# Patient Record
Sex: Female | Born: 1965
Health system: Southern US, Community
[De-identification: ages and names within clinical notes are randomized; demographics above are authoritative.]

## PROBLEM LIST (undated history)

## (undated) DIAGNOSIS — M722 Plantar fascial fibromatosis: Secondary | ICD-10-CM

## (undated) DIAGNOSIS — N6019 Diffuse cystic mastopathy of unspecified breast: Secondary | ICD-10-CM

## (undated) DIAGNOSIS — E785 Hyperlipidemia, unspecified: Secondary | ICD-10-CM

## (undated) DIAGNOSIS — G500 Trigeminal neuralgia: Secondary | ICD-10-CM

## (undated) DIAGNOSIS — I10 Essential (primary) hypertension: Secondary | ICD-10-CM

## (undated) DIAGNOSIS — C439 Malignant melanoma of skin, unspecified: Secondary | ICD-10-CM

## (undated) HISTORY — DX: Essential (primary) hypertension: I10

## (undated) HISTORY — DX: Plantar fascial fibromatosis: M72.2

## (undated) HISTORY — DX: Diffuse cystic mastopathy of unspecified breast: N60.19

## (undated) HISTORY — DX: Hyperlipidemia, unspecified: E78.5

## (undated) HISTORY — DX: Trigeminal neuralgia: G50.0

## (undated) HISTORY — DX: Malignant melanoma of skin, unspecified: C43.9

---

## 1992-07-25 HISTORY — PX: SPLENECTOMY: SUR1306

## 1998-10-29 ENCOUNTER — Encounter: Payer: Self-pay | Admitting: Orthopedic Surgery

## 1998-10-29 ENCOUNTER — Ambulatory Visit (HOSPITAL_COMMUNITY): Admission: RE | Admit: 1998-10-29 | Discharge: 1998-10-29 | Payer: Self-pay | Admitting: Orthopedic Surgery

## 2001-05-10 ENCOUNTER — Ambulatory Visit (HOSPITAL_COMMUNITY): Admission: RE | Admit: 2001-05-10 | Discharge: 2001-05-10 | Payer: Self-pay | Admitting: *Deleted

## 2001-05-10 ENCOUNTER — Encounter: Payer: Self-pay | Admitting: *Deleted

## 2001-08-16 ENCOUNTER — Ambulatory Visit (HOSPITAL_BASED_OUTPATIENT_CLINIC_OR_DEPARTMENT_OTHER): Admission: RE | Admit: 2001-08-16 | Discharge: 2001-08-16 | Payer: Self-pay | Admitting: Orthopedic Surgery

## 2001-10-25 ENCOUNTER — Ambulatory Visit (HOSPITAL_BASED_OUTPATIENT_CLINIC_OR_DEPARTMENT_OTHER): Admission: RE | Admit: 2001-10-25 | Discharge: 2001-10-25 | Payer: Self-pay | Admitting: Orthopedic Surgery

## 2002-08-15 ENCOUNTER — Encounter: Payer: Self-pay | Admitting: *Deleted

## 2002-08-15 ENCOUNTER — Encounter: Admission: RE | Admit: 2002-08-15 | Discharge: 2002-08-15 | Payer: Self-pay | Admitting: *Deleted

## 2003-08-28 ENCOUNTER — Other Ambulatory Visit: Admission: RE | Admit: 2003-08-28 | Discharge: 2003-08-28 | Payer: Self-pay | Admitting: Family Medicine

## 2004-10-11 ENCOUNTER — Other Ambulatory Visit: Admission: RE | Admit: 2004-10-11 | Discharge: 2004-10-11 | Payer: Self-pay | Admitting: Family Medicine

## 2005-11-18 ENCOUNTER — Other Ambulatory Visit: Admission: RE | Admit: 2005-11-18 | Discharge: 2005-11-18 | Payer: Self-pay | Admitting: Family Medicine

## 2005-12-06 ENCOUNTER — Encounter: Admission: RE | Admit: 2005-12-06 | Discharge: 2005-12-06 | Payer: Self-pay | Admitting: Family Medicine

## 2006-12-27 ENCOUNTER — Encounter: Admission: RE | Admit: 2006-12-27 | Discharge: 2006-12-27 | Payer: Self-pay | Admitting: Family Medicine

## 2007-01-22 ENCOUNTER — Other Ambulatory Visit: Admission: RE | Admit: 2007-01-22 | Discharge: 2007-01-22 | Payer: Self-pay | Admitting: Family Medicine

## 2008-03-07 ENCOUNTER — Other Ambulatory Visit: Admission: RE | Admit: 2008-03-07 | Discharge: 2008-03-07 | Payer: Self-pay | Admitting: Family Medicine

## 2008-12-15 ENCOUNTER — Encounter: Admission: RE | Admit: 2008-12-15 | Discharge: 2008-12-15 | Payer: Self-pay | Admitting: Family Medicine

## 2009-05-01 ENCOUNTER — Other Ambulatory Visit: Admission: RE | Admit: 2009-05-01 | Discharge: 2009-05-01 | Payer: Self-pay | Admitting: Family Medicine

## 2010-02-09 ENCOUNTER — Encounter: Admission: RE | Admit: 2010-02-09 | Discharge: 2010-02-09 | Payer: Self-pay | Admitting: Family Medicine

## 2010-12-10 NOTE — Op Note (Signed)
[place]. [facility]  Patient:    Brittany Newman, Brittany Newman Visit Number: [id number] MRN: [id number]          Service Type: DSU Location: [facility] Attending Physician:  Colbert Ewing Dictated by:   Loreta Ave, M.D. Proc. Date: 10/25/01 Admit Date:  10/25/2001 Discharge Date: 10/25/2001                             Operative Report  PREOPERATIVE DIAGNOSIS:  Tenosynovitis, first dorsal compartment left wrist.  POSTOPERATIVE DIAGNOSIS:  Tenosynovitis, first dorsal compartment left wrist.  PROCEDURE:  Release first dorsal compartment, left wrist.  SURGEON:  Loreta Ave, M.D.  ASSISTANT:  Arlys John D. Petrarca, P.A.-C.  ANESTHESIA:  IV regional.  SPECIMENS:  None.  COMPLICATIONS:  None.  DRESSING:  Soft compressive with a short-arm thumb spica splint.  DESCRIPTION OF PROCEDURE:  The patient brought to the operating room and after adequate anesthesia had been obtained, left arm prepped and draped in the usual sterile fashion.  Transverse incision over the first dorsal compartment, avoiding injury to the superficial branch radial nerve.  Retinaculum over the first dorsal compartment identified and incised in its entirety from proximal to distal out.  Very thickened with underlying tenosynovitis.  The inflammatory tissue debrided.  Small window left in the retinaculum.  Two other compartments found with two other separate tendons within this, and all were completely decompressed, so I was sure that all tendons within the area of constriction were decompressed in their entirety.  The wound irrigated. Skin closed with nylon.  Margins of the wound injected with Marcaine.  A sterile compressive dressing and splint applied.  Anesthesia reversed, brought to the recovery room.  Tolerated the surgery well with no complications. Dictated by:   Loreta Ave, M.D. Attending Physician:  Colbert Ewing DD:  10/25/01 TD:  10/26/01 Job:  [phone]

## 2010-12-10 NOTE — Op Note (Signed)
[place]. [facility]  Patient:    Brittany Newman, Brittany Newman Visit Number: [id number] MRN: [id number]          Service Type: DSU Location: [facility] Attending Physician:  Colbert Ewing Dictated by:   Loreta Ave, M.D. Proc. Date: 08/16/01 Admit Date:  08/16/2001                             Operative Report  PREOPERATIVE DIAGNOSIS:  De Quervains tenosynovitis, first dorsal compartment, right wrist.  POSTOPERATIVE DIAGNOSIS:  De Quervains tenosynovitis, first dorsal compartment, right wrist.  OPERATION PERFORMED:  Release first dorsal compartment of right wrist with tenosynovectomy.  SURGEON:  Loreta Ave, M.D.  ASSISTANT:  Arlys John D. Petrarca, P.A.-C.  ANESTHESIA:  IV regional.  SPECIMENS:  None.  CULTURES:  None.  COMPLICATIONS:  None.  DRESSING:  Soft compressive with bulky hand dressing and thumb spica splint.  DESCRIPTION OF PROCEDURE:  The patient was brought to the operating room and placed on the operating table in supine position.  After adequate anesthesia had been obtained, the right arm prepped and draped in the usual sterile fashion.  Small transverse incision over the first dorsal compartment avoiding injury to the superficial radial nerve.  The nerve identified, protected, retracted.  First dorsal compartment incised in its entirety from proximal to distal to completely decompress this.  Two separate compartments within this and three separate tendons.  All identified, completely decompressed with tenosynovectomy.  One tendon had some longitudinal tearing but no transverse structural tears.  After I was comfortable that I had nice thorough decompression of all subcompartments, the wound was irrigated and closed with nylon.  Margins of wound were injected with Marcaine.  Sterile compressive dressing applied with thumb spica splint.  Anesthesia reversed.  Brought to recovery room.  Tolerated surgery well.  No  complications. Dictated by:   Loreta Ave, M.D. Attending Physician:  Colbert Ewing DD:  08/16/01 TD:  08/17/01 Job: [id number]

## 2011-04-06 ENCOUNTER — Other Ambulatory Visit: Payer: Self-pay | Admitting: Family Medicine

## 2011-04-06 DIAGNOSIS — Z1231 Encounter for screening mammogram for malignant neoplasm of breast: Secondary | ICD-10-CM

## 2011-04-25 ENCOUNTER — Ambulatory Visit
Admission: RE | Admit: 2011-04-25 | Discharge: 2011-04-25 | Disposition: A | Discharge status: Home / Self Care | Payer: BC Managed Care – PPO | Source: Ambulatory Visit | Attending: Family Medicine | Admitting: Family Medicine

## 2011-04-25 DIAGNOSIS — Z1231 Encounter for screening mammogram for malignant neoplasm of breast: Secondary | ICD-10-CM

## 2011-05-02 ENCOUNTER — Other Ambulatory Visit: Payer: Self-pay | Admitting: Family Medicine

## 2011-05-02 DIAGNOSIS — R928 Other abnormal and inconclusive findings on diagnostic imaging of breast: Secondary | ICD-10-CM

## 2011-05-11 ENCOUNTER — Ambulatory Visit
Admission: RE | Admit: 2011-05-11 | Discharge: 2011-05-11 | Disposition: A | Discharge status: Home / Self Care | Payer: BC Managed Care – PPO | Source: Ambulatory Visit | Attending: Family Medicine | Admitting: Family Medicine

## 2011-05-11 DIAGNOSIS — R928 Other abnormal and inconclusive findings on diagnostic imaging of breast: Secondary | ICD-10-CM

## 2011-09-07 ENCOUNTER — Encounter: Payer: Self-pay | Admitting: Pulmonary Disease

## 2011-09-08 ENCOUNTER — Encounter: Payer: Self-pay | Admitting: Internal Medicine

## 2011-09-08 ENCOUNTER — Ambulatory Visit (INDEPENDENT_AMBULATORY_CARE_PROVIDER_SITE_OTHER): Payer: BC Managed Care – PPO | Admitting: Internal Medicine

## 2011-09-08 VITALS — BP 112/80 | HR 109 | Temp 98.8°F | Ht 63.0 in | Wt 158.0 lb

## 2011-09-08 DIAGNOSIS — R05 Cough: Secondary | ICD-10-CM | POA: Insufficient documentation

## 2011-09-08 DIAGNOSIS — R059 Cough, unspecified: Secondary | ICD-10-CM | POA: Insufficient documentation

## 2011-09-08 MED ORDER — FAMOTIDINE 20 MG PO TABS: ORAL_TABLET | ORAL | Status: DC

## 2011-09-08 MED ORDER — TRAMADOL HCL 50 MG PO TABS: ORAL_TABLET | ORAL | Status: AC

## 2011-09-08 MED ORDER — PREDNISONE (PAK) 10 MG PO TABS: ORAL_TABLET | ORAL | Status: AC

## 2011-09-08 NOTE — Assessment & Plan Note (Signed)
The most common causes of chronic cough in immunocompetent adults include the following: upper airway cough syndrome (UACS), previously referred to as postnasal drip syndrome (PNDS), which is caused by variety of rhinosinus conditions; (2) asthma; (3) GERD; (4) chronic bronchitis from cigarette smoking or other inhaled environmental irritants; (5) nonasthmatic eosinophilic bronchitis; and (6) bronchiectasis.   These conditions, singly or in combination, have accounted for up to 94% of the causes of chronic cough in prospective studies.   Other conditions have constituted no >6% of the causes in prospective studies These have included bronchogenic carcinoma, chronic interstitial pneumonia, sarcoidosis, left ventricular failure, ACEI-induced cough, and aspiration from a condition associated with pharyngeal dysfunction.   This is most likely  Classic Upper airway cough syndrome, so named because it's frequently impossible to sort out how much is  CR/sinusitis with freq throat clearing (which can be related to primary GERD)   vs  causing  secondary (" extra esophageal")  GERD from wide swings in gastric pressure that occur with throat clearing, often  promoting self use of mint and menthol lozenges that reduce the lower esophageal sphincter tone and exacerbate the problem further in a cyclical fashion.   These are the same pts who not infrequently have failed to tolerate ace inhibitors,  dry powder inhalers or biphosphonates or report having reflux symptoms that don't respond to standard doses of PPI , and are easily confused as having aecopd or asthma flares.  Try max gerd rx while suppressing cough for at least 3 days then regroup with sinus ct if not improving.

## 2011-09-08 NOTE — Patient Instructions (Addendum)
The key to effective treatment for your cough is eliminating the non-stop cycle of cough you're stuck in long enough to let your airway heal completely and then see if there is anything still making you cough once you stop the cough suppression, but this should take no more than 5 days to figuure out  First take delsym two tsp every 12 hours and supplement if needed with  tramadol 50 mg up to 2 every 4 hours to suppress the urge to cough. Swallowing water or using ice chips/non mint and menthol containing candies (such as lifesavers or sugarless jolly ranchers) are also effective.  You should rest your voice and avoid activities that you know make you cough.  Once you have eliminated the cough for 3 straight days try reducing the tramadol first,  then the delsym as tolerated.    Try  Dexilant 60 mg  Take 30-60 min before first meal of the day and Pepcid 20 mg one bedtime until cough is completely gone for at least a week without the need for cough suppression  I think of reflux for chronic cough like I do oxygen for fire (doesn't cause the fire but once you get the oxygen suppressed it usually goes away regardless of the exact cause).  GERD (REFLUX)  is an extremely common cause of respiratory symptoms, many times with no significant heartburn at all.    It can be treated with medication, but also with lifestyle changes including avoidance of late meals, excessive alcohol, smoking cessation, and avoid fatty foods, chocolate, peppermint, colas, red wine, and acidic juices such as orange juice.  NO MINT OR MENTHOL PRODUCTS SO NO COUGH DROPS  USE SUGARLESS CANDY INSTEAD (jolley ranchers or Stover's)  NO OIL BASED VITAMINS - use powdered substitutes. NO FISH OIL    Prednisone 10 mg take  4 each am x 2 days,   2 each am x 2 days,  1 each am x2days and stop    If not 100% better we need to see you back in 2 weeks

## 2011-09-08 NOTE — Progress Notes (Signed)
  Subjective:    Patient ID: Brittany Newman, female    DOB: [birth year]  MRN: [id number]  HPI   Brief patient profile:  59 yowf never smoker with onset of sinus problems while in [place] then moved [place] and evaluated by [facility] [place] and placed on shots x 9 years and rx by [facility] with cauterization overall some better but always sense of nasal congestion year round with most severe to ragweed with symptoms worse fall > winter but much less frequent sinus infections since but Oct severe cough/ high fever  dx'd pneumonia sob never completely normal so referred by [facility] 09/08/2011 to pulmonayr clinic   HPI 09/08/2011 1st pulmonary evaluation cc persistent sensation can't get a full breath acute onset Oct 2012 with tightness in midline through to back assoc with sore throat still some dry barking cough and wheezing ? Some better p proaire but also better p relaxing. Has coughed so hard vomited and urinary incont - prednisone completely eliminated but only transiently. Symptoms mostly daytime and really only sob when coughing.  Sleeping ok without nocturnal  or early am exacerbation  of respiratory  c/o's or need for noct saba. Also denies any obvious fluctuation of symptoms with weather or environmental changes or other aggravating or alleviating factors except as outlined above   Review of Systems  Constitutional: Negative for fever, chills and unexpected weight change.  HENT: Positive for sore throat. Negative for ear pain, nosebleeds, congestion, rhinorrhea, sneezing, trouble swallowing, dental problem, voice change, postnasal drip and sinus pressure.   Eyes: Negative for visual disturbance.  Respiratory: Positive for cough and shortness of breath. Negative for choking.   Cardiovascular: Positive for chest pain. Negative for leg swelling.  Gastrointestinal: Negative for vomiting, abdominal pain and diarrhea.  Genitourinary: Negative for difficulty urinating.  Musculoskeletal: Negative for  arthralgias.  Skin: Negative for rash.  Neurological: Negative for tremors, syncope and headaches.  Hematological: Does not bruise/bleed easily.       Objective:   Physical Exam amb wm nad  Wt 158 09/08/2011 HEENT: nl dentition, turbinates, and orophanx. Nl external ear canals without cough reflex   NECK :  without JVD/Nodes/TM/ nl carotid upstrokes bilaterally   LUNGS: no acc muscle use, clear to A and P bilaterally without cough on insp or exp maneuvers   CV:  RRR  no s3 or murmur or increase in P2, no edema   ABD:  soft and nontender with nl excursion in the supine position. No bruits or organomegaly, bowel sounds nl  MS:  warm without deformities, calf tenderness, cyanosis or clubbing  SKIN: warm and dry without lesions    NEURO:  alert, approp, no deficits         Assessment & Plan:

## 2011-09-26 ENCOUNTER — Encounter: Payer: Self-pay | Admitting: Internal Medicine

## 2011-09-26 ENCOUNTER — Ambulatory Visit (INDEPENDENT_AMBULATORY_CARE_PROVIDER_SITE_OTHER): Payer: BC Managed Care – PPO | Admitting: Internal Medicine

## 2011-09-26 VITALS — BP 126/80 | HR 89 | Temp 98.7°F | Ht 63.0 in | Wt 159.0 lb

## 2011-09-26 DIAGNOSIS — Z23 Encounter for immunization: Secondary | ICD-10-CM

## 2011-09-26 DIAGNOSIS — R0989 Other specified symptoms and signs involving the circulatory and respiratory systems: Secondary | ICD-10-CM

## 2011-09-26 DIAGNOSIS — R0609 Other forms of dyspnea: Secondary | ICD-10-CM

## 2011-09-26 DIAGNOSIS — R05 Cough: Secondary | ICD-10-CM

## 2011-09-26 DIAGNOSIS — R0600 Dyspnea, unspecified: Secondary | ICD-10-CM

## 2011-09-26 DIAGNOSIS — R059 Cough, unspecified: Secondary | ICD-10-CM

## 2011-09-26 MED ORDER — OMEPRAZOLE MAGNESIUM 20 MG PO TBEC: 20.0000 mg | DELAYED_RELEASE_TABLET | Freq: Every day | ORAL | Status: DC

## 2011-09-26 NOTE — Progress Notes (Signed)
  Subjective:    Patient ID: Brittany Newman, female    DOB: [birth year]  MRN: [id number]    Brief patient profile:  19 yowf never smoker with onset of sinus problems while in [place] then moved [place] and evaluated by Brittany Newman and placed on shots x 9 years and rx by Brittany Newman with cauterization overall some better but always sense of nasal congestion year round with most severe to ragweed with symptoms worse fall > winter but much less frequent sinus infections since but Oct severe cough/ high fever  dx'd pneumonia sob never completely normal so referred by Brittany Newman 09/08/2011 to pulmonary clinic   HPI 09/08/2011 1st pulmonary evaluation cc persistent sensation can't get a full breath acute onset Oct 2012 with tightness in midline through to back assoc with sore throat still some dry barking cough and wheezing ? Some better p proaire but also better p relaxing. Has coughed so hard vomited and urinary incont - prednisone completely eliminated but only transiently. Symptoms mostly daytime and really only sob when coughing. rec Cyclical cough regimen   09/26/2011 f/u ov/Brittany Newman cc cough resolved, not needing delsym. Variable doe to point where doesn't feel like heavy exertion but adls - saba may help some p 30 min delay despite suboptimal hfa  Sleeping ok without nocturnal  or early am exacerbation  of respiratory  c/o's or need for noct saba. Also denies any obvious fluctuation of symptoms with weather or environmental changes or other aggravating or alleviating factors except as outlined above   ROS  At present neg for  any significant sore throat, dysphagia, itching, sneezing,  nasal congestion or excess/ purulent secretions,  fever, chills, sweats, unintended wt loss, pleuritic or exertional cp, hempoptysis, orthopnea pnd or leg swelling.  Also denies presyncope, palpitations, heartburn, abdominal pain, nausea, vomiting, diarrhea  or change in bowel or urinary habits, dysuria,hematuria,  rash,  arthralgias, visual complaints, headache, numbness weakness or ataxia.           Objective:   Physical Exam  amb wf nad   Wt 158 09/08/2011 > 09/26/2011  159  HEENT: nl dentition, turbinates, and orophanx. Nl external ear canals without cough reflex   NECK :  without JVD/Nodes/TM/ nl carotid upstrokes bilaterally   LUNGS: no acc muscle use, clear to A and P bilaterally without cough on insp or exp maneuvers   CV:  RRR  no s3 or murmur or increase in P2, no edema   ABD:  soft and nontender with nl excursion in the supine position. No bruits or organomegaly, bowel sounds nl  MS:  warm without deformities, calf tenderness, cyanosis or clubbing         Assessment & Plan:

## 2011-09-26 NOTE — Assessment & Plan Note (Signed)
Resolved with elimination of cyclical cough but still doe so ? Element of asthma> The proper method of use, as well as anticipated side effects, of this metered-dose inhaler are discussed and demonstrated to the patient. Improved to 75% with coaching  .Chronic cough is often simultaneously caused by more than one condition. A single cause has been found from 38 to 82% of the time, multiple causes from 18 to 62%. Multiply caused cough has been the result of three diseases up to 42% of the time.    ? Role of reflux primary or secondary > continue to suppress until returns for pft's and if nl > Methacholine challenge

## 2011-09-26 NOTE — Assessment & Plan Note (Signed)
-   09/26/2011  Walked RA x 3 laps @ 185 ft each stopped due to  End of study, no desat  Not able to reproduce this problem in office and by hx really doesn't improve with saba convincingly. Therefore Symptoms are markedly disproportionate to objective findings and not clear this is even a lung problem but pt does appear to have difficult airway management issues.   DDX of  difficult airways managment all start with A and  include Adherence, Ace Inhibitors, Acid Reflux, Active Sinus Disease, Alpha 1 Antitripsin deficiency, Anxiety masquerading as Airways dz,  ABPA,  allergy(esp in young), Aspiration (esp in elderly), Adverse effects of DPI,  Active smokers, plus two Bs  = Bronchiectasis and Beta blocker use..and one C= CHF   ? Acid reflux related > continue rx with ppi/h2 hs and diet pending completion of w/u.  ? Anxiety > dx of exclusion

## 2011-09-26 NOTE — Patient Instructions (Addendum)
Pneumovax today  Work on perfecting  inhaler technique:  relax and gently blow all the way out then take a nice smooth deep breath back in, triggering the inhaler at same time you start breathing in.  Hold for up to 5 seconds if you can.  Rinse and gargle with water when done   If your mouth or throat starts to bother you,   I suggest you time the inhaler to your dental care and after using the inhaler(s) brush teeth and tongue with a baking soda containing toothpaste and when you rinse this out, gargle with it first to see if this helps your mouth and throat.      Please schedule a follow up office visit in 4 weeks, sooner if needed with pft's on return

## 2011-10-04 ENCOUNTER — Telehealth: Payer: Self-pay | Admitting: Internal Medicine

## 2011-10-04 NOTE — Telephone Encounter (Signed)
Pt return call. Please call back at [phone]

## 2011-10-04 NOTE — Telephone Encounter (Signed)
Pt consulted with primary md regarding a replacement for Depo Provera shot but he did not know of a similar replacement without referring pt to a specialist for the Norplant.  PT was not interested in this.  Please advise on what you recommend for pt to do.

## 2011-10-04 NOTE — Telephone Encounter (Signed)
LMOMTCB x 1 

## 2011-10-04 NOTE — Telephone Encounter (Signed)
LMOM for pt TCB 

## 2011-10-04 NOTE — Telephone Encounter (Signed)
The issue is all progestational hormones can cause reflux so may need to explore other options altogether but Dr Azucena Cecil can refer her to whoever he choses and certainly many other options out there she can chose from.   (I don't coordinate referrals for pts Im seeing as a referrajl)

## 2011-10-05 NOTE — Telephone Encounter (Signed)
Pt returned call & requests to be contacted at her work number, [phone] ask for [facility].  Brittany Newman

## 2011-10-05 NOTE — Telephone Encounter (Signed)
Pt notified of MW recs and will talk with Dr. Azucena Cecil about other options. Pt verbalized understanding.

## 2011-10-27 ENCOUNTER — Encounter: Payer: Self-pay | Admitting: Internal Medicine

## 2011-10-27 ENCOUNTER — Ambulatory Visit (INDEPENDENT_AMBULATORY_CARE_PROVIDER_SITE_OTHER): Payer: BC Managed Care – PPO | Admitting: Internal Medicine

## 2011-10-27 VITALS — BP 116/82 | HR 110 | Temp 98.4°F | Ht 63.0 in | Wt 161.0 lb

## 2011-10-27 DIAGNOSIS — R0609 Other forms of dyspnea: Secondary | ICD-10-CM

## 2011-10-27 DIAGNOSIS — R0989 Other specified symptoms and signs involving the circulatory and respiratory systems: Secondary | ICD-10-CM

## 2011-10-27 DIAGNOSIS — R0600 Dyspnea, unspecified: Secondary | ICD-10-CM

## 2011-10-27 LAB — PULMONARY FUNCTION TEST

## 2011-10-27 NOTE — Progress Notes (Signed)
  Subjective:    Patient ID: Brittany Newman, female    DOB: Jul 17, 1966  MRN: [id number]    Brief patient profile:  42 yowf never smoker with onset of sinus problems while in [place] then moved [place] and evaluated by [facility] [place] and placed on shots x 9 years and rx by [facility] with cauterization overall some better but always sense of nasal congestion year round with most severe to ragweed with symptoms worse fall > winter but much less frequent sinus infections since but Oct 2012 severe cough/ high fever  dx'd pneumonia sob never completely normal so referred by [facility] 09/08/2011 to pulmonary clinic   HPI 09/08/2011 1st pulmonary evaluation cc persistent sensation can't get a full breath acute onset Oct 2012 with tightness in midline through to back assoc with sore throat still some dry barking cough and wheezing ? Some better p proaire but also better p relaxing. Has coughed so hard vomited and urinary incont - prednisone completely eliminated but only transiently. Symptoms mostly daytime and really only sob when coughing. rec Cyclical cough regimen   09/26/2011 f/u ov/[facility] [place] cc cough resolved, not needing delsym. Variable doe to point where doesn't feel like heavy exertion but adls - saba may help some p 30 min delay despite suboptimal hfa. rec Pneumovax today Work on perfecting  inhaler technique   10/27/2011 f/u ov/Kacy Conely cc doe  70% back to normal p 100% better on prednisone.  On depoprovera noted. No cough or overt sinus or hb symptoms.   Sleeping ok without nocturnal  or early am exacerbation  of respiratory  c/o's or need for noct saba. Also denies any obvious fluctuation of symptoms with weather or environmental changes or other aggravating or alleviating factors except as outlined above   ROS  At present neg for  any significant sore throat, dysphagia, itching, sneezing,  nasal congestion or excess/ purulent secretions,  fever, chills, sweats, unintended wt loss, pleuritic or  exertional cp, hempoptysis, orthopnea pnd or leg swelling.  Also denies presyncope, palpitations, heartburn, abdominal pain, nausea, vomiting, diarrhea  or change in bowel or urinary habits, dysuria,hematuria,  rash, arthralgias, visual complaints, headache, numbness weakness or ataxia.           Objective:   Physical Exam  amb wf nad   Wt 158 09/08/2011 > 09/26/2011  159> 10/27/2011  161   HEENT: nl dentition, turbinates, and orophanx. Nl external ear canals without cough reflex   NECK :  without JVD/Nodes/TM/ nl carotid upstrokes bilaterally   LUNGS: no acc muscle use, clear to A and P bilaterally without cough on insp or exp maneuvers   CV:  RRR  no s3 or murmur or increase in P2, no edema   ABD:  soft and nontender with nl excursion in the supine position. No bruits or organomegaly, bowel sounds nl  MS:  warm without deformities, calf tenderness, cyanosis or clubbing         Assessment & Plan:

## 2011-10-27 NOTE — Progress Notes (Signed)
PFT done today. 

## 2011-10-27 NOTE — Assessment & Plan Note (Addendum)
-   09/26/2011  Walked RA x 3 laps @ 185 ft each stopped due to  End of study, no desat    - PFT's wnl 10/27/2011     - Echo 2d ordered 10/27/2011 >>>  I had an extended discussion with the patient today lasting 15 to 20 minutes of a 25 minute visit on the following issues:   Reviewed pfts, simply no evidence to support ongoing sob on basis of pfts. Concerned with HRT at age 46 places her at risk of dvt/PE and occult gerd but she is clearly improved vs baseline and therefore just needs echo to complete the w/u for chronic unexplained doe.

## 2011-10-27 NOTE — Patient Instructions (Addendum)
If breathing gets worse in absence of a coughing flare then you need additional work up would include a blood work for unexplained short of breath and a ct chest scan.  Please see patient coordinator before you leave today  to schedule echocardiogram and we will call you with results

## 2011-11-10 ENCOUNTER — Other Ambulatory Visit (HOSPITAL_COMMUNITY): Payer: Self-pay | Admitting: Radiology

## 2011-11-10 ENCOUNTER — Other Ambulatory Visit: Payer: Self-pay

## 2011-11-10 ENCOUNTER — Encounter: Payer: Self-pay | Admitting: Internal Medicine

## 2011-11-10 ENCOUNTER — Ambulatory Visit (HOSPITAL_COMMUNITY): Payer: BC Managed Care – PPO | Attending: Internal Medicine

## 2011-11-10 DIAGNOSIS — R0989 Other specified symptoms and signs involving the circulatory and respiratory systems: Secondary | ICD-10-CM

## 2011-11-10 DIAGNOSIS — R0600 Dyspnea, unspecified: Secondary | ICD-10-CM

## 2011-11-10 DIAGNOSIS — R0609 Other forms of dyspnea: Secondary | ICD-10-CM | POA: Insufficient documentation

## 2011-11-11 ENCOUNTER — Telehealth: Payer: Self-pay | Admitting: Internal Medicine

## 2011-11-11 NOTE — Progress Notes (Signed)
Quick Note:  Spoke with pt and notified of results per Dr. Wert. Pt verbalized understanding and denied any questions.  ______ 

## 2011-11-11 NOTE — Telephone Encounter (Signed)
Spoke with pt and notified of ECHO results per Dr. Sherene Sires. Pt verbalized understanding and denied any questions.

## 2012-05-02 ENCOUNTER — Other Ambulatory Visit: Payer: Self-pay | Admitting: Family Medicine

## 2012-05-02 DIAGNOSIS — Z1231 Encounter for screening mammogram for malignant neoplasm of breast: Secondary | ICD-10-CM

## 2012-05-14 ENCOUNTER — Ambulatory Visit
Admission: RE | Admit: 2012-05-14 | Discharge: 2012-05-14 | Disposition: A | Discharge status: Home / Self Care | Payer: BC Managed Care – PPO | Source: Ambulatory Visit | Attending: Family Medicine | Admitting: Family Medicine

## 2012-05-14 DIAGNOSIS — Z1231 Encounter for screening mammogram for malignant neoplasm of breast: Secondary | ICD-10-CM

## 2012-06-11 ENCOUNTER — Other Ambulatory Visit (HOSPITAL_COMMUNITY)
Admission: RE | Admit: 2012-06-11 | Discharge: 2012-06-11 | Disposition: A | Discharge status: Home / Self Care | Payer: BC Managed Care – PPO | Source: Ambulatory Visit | Attending: Family Medicine | Admitting: Family Medicine

## 2012-06-11 ENCOUNTER — Other Ambulatory Visit: Payer: Self-pay | Admitting: Family Medicine

## 2012-06-11 DIAGNOSIS — Z01419 Encounter for gynecological examination (general) (routine) without abnormal findings: Secondary | ICD-10-CM | POA: Insufficient documentation

## 2012-06-12 ENCOUNTER — Other Ambulatory Visit: Payer: Self-pay | Admitting: Family Medicine

## 2012-06-12 DIAGNOSIS — R599 Enlarged lymph nodes, unspecified: Secondary | ICD-10-CM

## 2012-06-14 ENCOUNTER — Ambulatory Visit
Admission: RE | Admit: 2012-06-14 | Discharge: 2012-06-14 | Disposition: A | Discharge status: Home / Self Care | Payer: BC Managed Care – PPO | Source: Ambulatory Visit | Attending: Family Medicine | Admitting: Family Medicine

## 2012-06-14 DIAGNOSIS — R599 Enlarged lymph nodes, unspecified: Secondary | ICD-10-CM

## 2012-10-03 ENCOUNTER — Telehealth: Payer: Self-pay | Admitting: General Practice

## 2012-10-05 NOTE — Telephone Encounter (Signed)
Error

## 2012-11-12 ENCOUNTER — Other Ambulatory Visit: Payer: Self-pay | Admitting: Family Medicine

## 2012-11-12 DIAGNOSIS — E041 Nontoxic single thyroid nodule: Secondary | ICD-10-CM

## 2012-11-19 ENCOUNTER — Other Ambulatory Visit: Payer: BC Managed Care – PPO

## 2012-12-11 ENCOUNTER — Ambulatory Visit
Admission: RE | Admit: 2012-12-11 | Discharge: 2012-12-11 | Disposition: A | Discharge status: Home / Self Care | Payer: BC Managed Care – PPO | Source: Ambulatory Visit | Attending: Family Medicine | Admitting: Family Medicine

## 2012-12-11 DIAGNOSIS — E041 Nontoxic single thyroid nodule: Secondary | ICD-10-CM

## 2013-04-11 ENCOUNTER — Other Ambulatory Visit: Payer: Self-pay

## 2013-04-11 DIAGNOSIS — Z1231 Encounter for screening mammogram for malignant neoplasm of breast: Secondary | ICD-10-CM

## 2013-05-20 ENCOUNTER — Ambulatory Visit
Admission: RE | Admit: 2013-05-20 | Discharge: 2013-05-20 | Disposition: A | Discharge status: Home / Self Care | Payer: BC Managed Care – PPO | Source: Ambulatory Visit

## 2013-05-20 DIAGNOSIS — Z1231 Encounter for screening mammogram for malignant neoplasm of breast: Secondary | ICD-10-CM

## 2013-10-05 IMAGING — US US SOFT TISSUE HEAD/NECK
1 series · 14 of 25 positions shown · non-contrast
Comparison: None.

CLINICAL DATA: Enlarged neck nodes.

THYROID ULTRASOUND
TECHNIQUE: Ultrasound examination of the thyroid gland and adjacent
soft tissues was performed.

[Series 1: us soft tissue head/neck · 0.03mm/px · 14 of 44 slices shown]
[im 1/44]
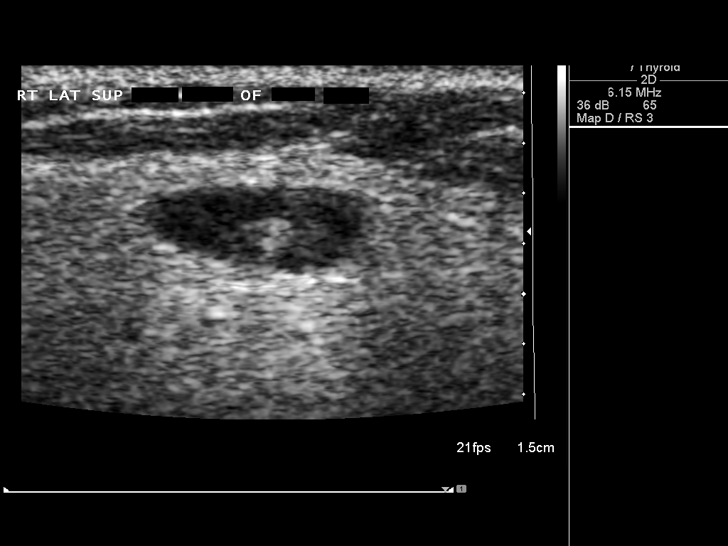
[im 4/44]
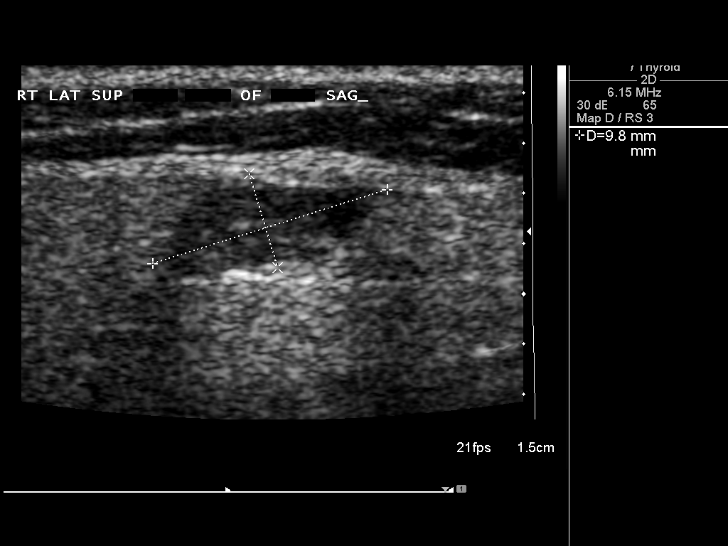
[im 8/44]
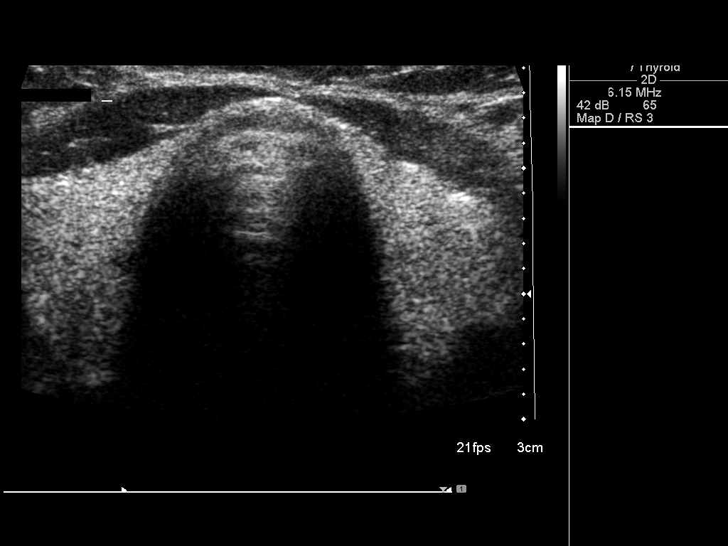
[im 11/44]
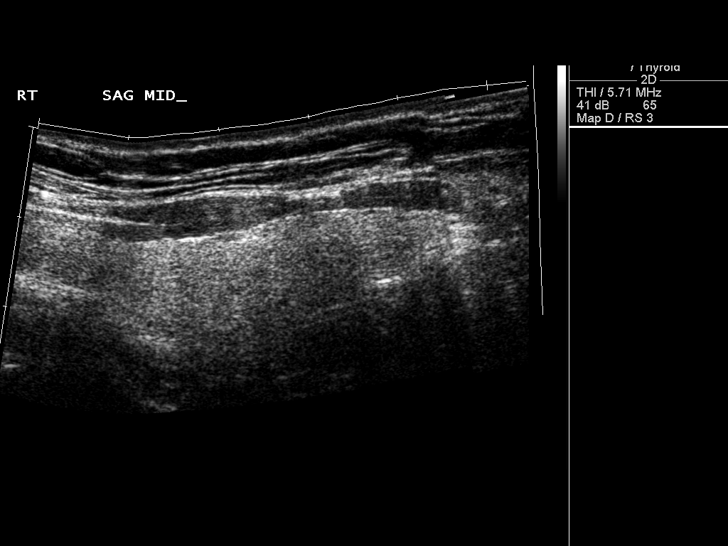
[im 15/44]
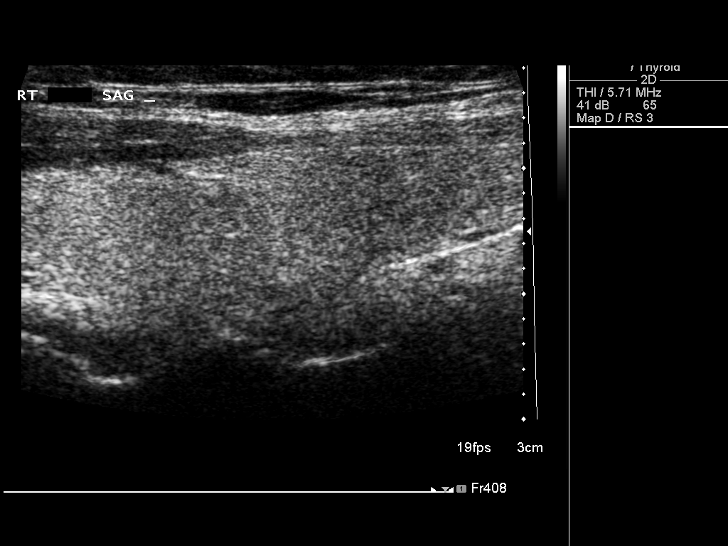
[im 17/44]
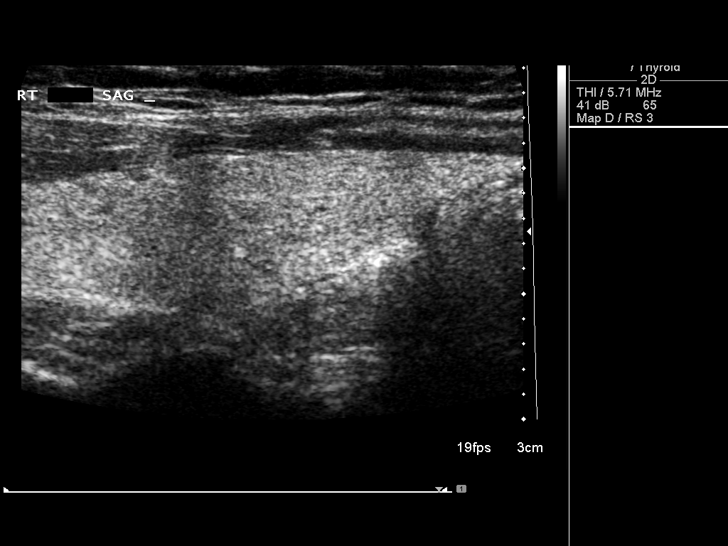
[im 20/44]
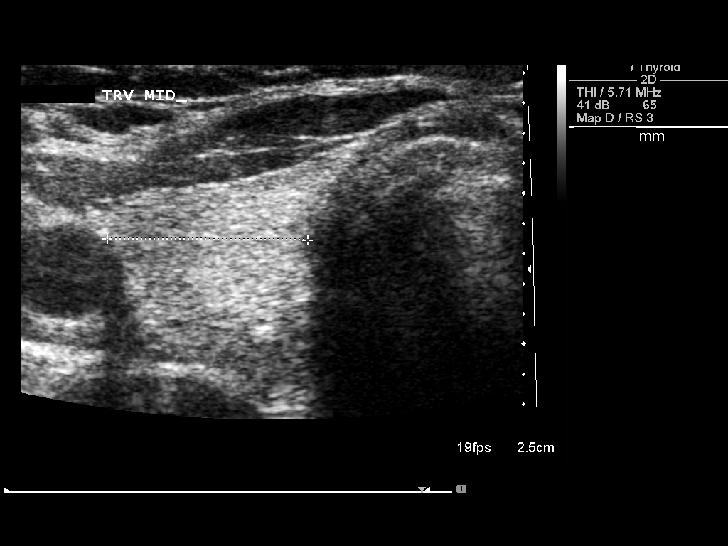
[im 24/44]
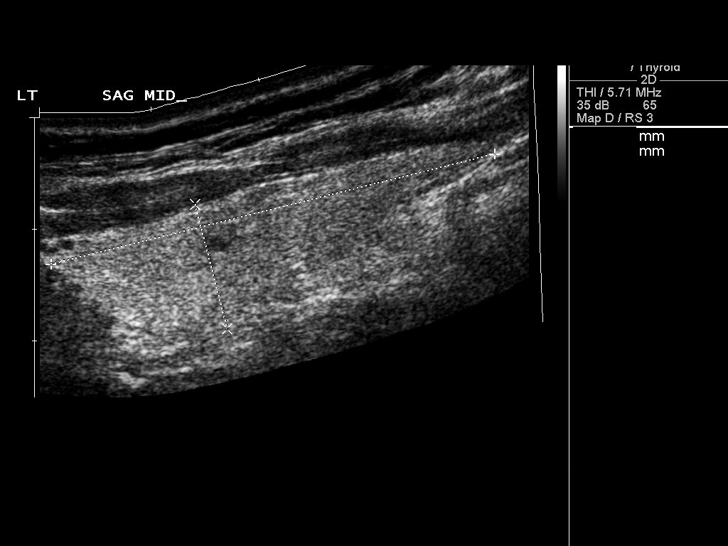
[im 27/44]
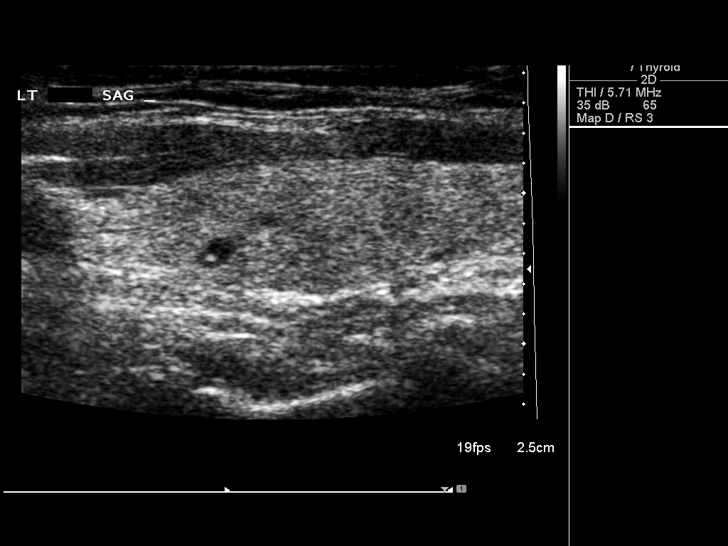
[im 29/44]
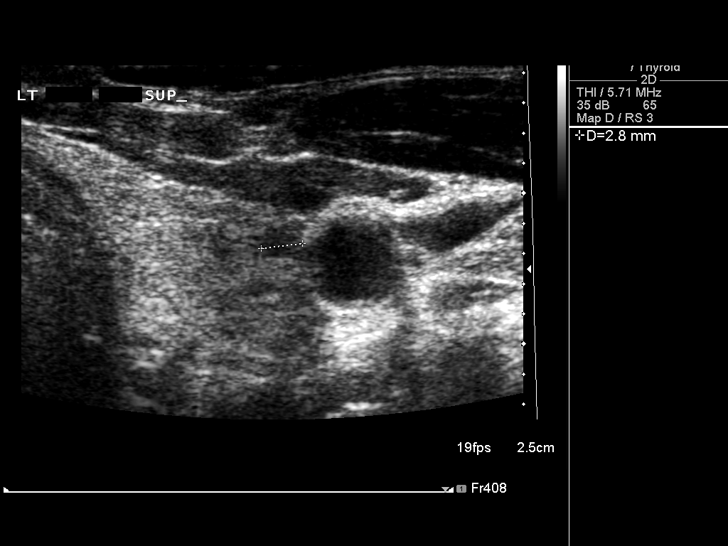
[im 33/44]
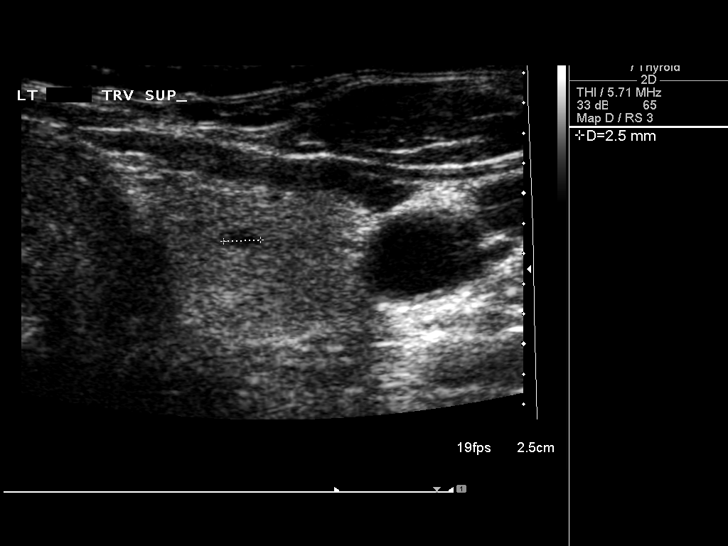
[im 36/44]
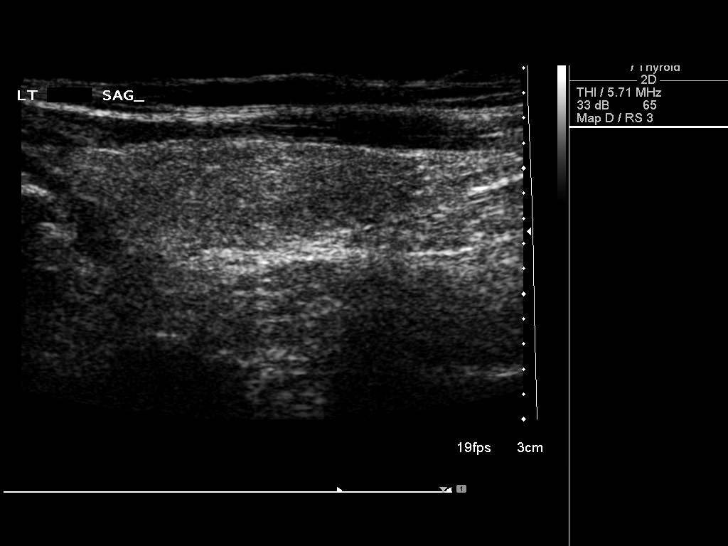
[im 40/44]
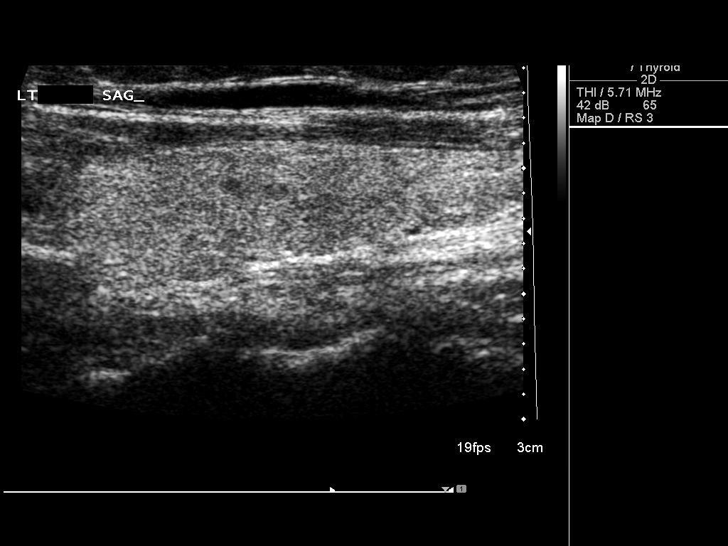
[im 44/44]
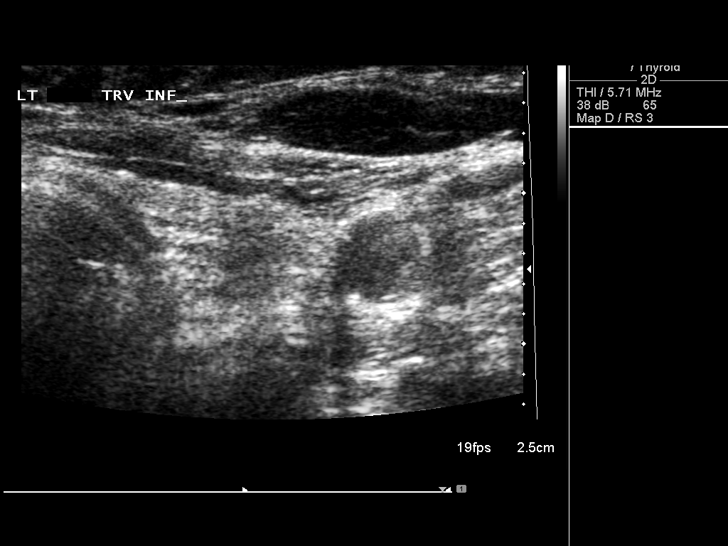

[14 of 25 positions shown; findings below may reference images not displayed]

FINDINGS: Right thyroid lobe:  4.0 x 1.3 x 1.3 cm.
Left thyroid lobe:  4.1 x 1.2 x 1.5 cm.
Isthmus:  0.1 cm

Homogeneous thyroid echotexture.

Focal nodules:  There are two small, 3 mm, left lobe nodules.  No
other thyroid lesions are seen.

Lymphadenopathy:  The largest lymph node in the right anterior neck
measures 1.0 x 0.4 x 0.9 cm.  No mass.
IMPRESSION: 1.  Normal thyroid ultrasound except for 3 mm nodules.
2.  Right anterior neck lymph node measuring a maximum of 1 cm.
This is considered a nonpathologic based on size.

## 2014-04-18 ENCOUNTER — Other Ambulatory Visit: Payer: Self-pay

## 2014-04-18 DIAGNOSIS — Z1231 Encounter for screening mammogram for malignant neoplasm of breast: Secondary | ICD-10-CM

## 2014-05-26 ENCOUNTER — Ambulatory Visit
Admission: RE | Admit: 2014-05-26 | Discharge: 2014-05-26 | Disposition: A | Discharge status: Home / Self Care | Payer: BC Managed Care – PPO | Source: Ambulatory Visit

## 2014-05-26 DIAGNOSIS — Z1231 Encounter for screening mammogram for malignant neoplasm of breast: Secondary | ICD-10-CM

## 2015-04-24 ENCOUNTER — Other Ambulatory Visit: Payer: Self-pay

## 2015-04-24 DIAGNOSIS — Z1231 Encounter for screening mammogram for malignant neoplasm of breast: Secondary | ICD-10-CM

## 2015-06-01 ENCOUNTER — Ambulatory Visit
Admission: RE | Admit: 2015-06-01 | Discharge: 2015-06-01 | Disposition: A | Discharge status: Home / Self Care | Payer: 59 | Source: Ambulatory Visit

## 2015-06-01 DIAGNOSIS — Z1231 Encounter for screening mammogram for malignant neoplasm of breast: Secondary | ICD-10-CM

## 2015-07-06 ENCOUNTER — Other Ambulatory Visit: Payer: Self-pay | Admitting: Family Medicine

## 2015-07-06 ENCOUNTER — Other Ambulatory Visit (HOSPITAL_COMMUNITY)
Admission: RE | Admit: 2015-07-06 | Discharge: 2015-07-06 | Disposition: A | Discharge status: Home / Self Care | Payer: 59 | Source: Ambulatory Visit | Attending: Family Medicine | Admitting: Family Medicine

## 2015-07-06 DIAGNOSIS — Z01419 Encounter for gynecological examination (general) (routine) without abnormal findings: Secondary | ICD-10-CM | POA: Insufficient documentation

## 2015-07-08 LAB — CYTOLOGY - PAP

## 2015-08-20 ENCOUNTER — Other Ambulatory Visit: Payer: Self-pay | Admitting: Gastroenterology

## 2015-08-20 HISTORY — PX: COLONOSCOPY: SHX174

## 2015-11-30 DIAGNOSIS — H109 Unspecified conjunctivitis: Secondary | ICD-10-CM | POA: Diagnosis not present

## 2016-03-25 DIAGNOSIS — Z23 Encounter for immunization: Secondary | ICD-10-CM | POA: Diagnosis not present

## 2016-05-02 ENCOUNTER — Other Ambulatory Visit: Payer: Self-pay | Admitting: Family Medicine

## 2016-05-02 DIAGNOSIS — Z1231 Encounter for screening mammogram for malignant neoplasm of breast: Secondary | ICD-10-CM

## 2016-06-06 ENCOUNTER — Ambulatory Visit
Admission: RE | Admit: 2016-06-06 | Discharge: 2016-06-06 | Disposition: A | Discharge status: Home / Self Care | Payer: Self-pay | Source: Ambulatory Visit | Attending: Family Medicine | Admitting: Family Medicine

## 2016-06-06 DIAGNOSIS — Z1231 Encounter for screening mammogram for malignant neoplasm of breast: Secondary | ICD-10-CM | POA: Diagnosis not present

## 2016-07-07 DIAGNOSIS — Z0000 Encounter for general adult medical examination without abnormal findings: Secondary | ICD-10-CM | POA: Diagnosis not present

## 2016-07-07 DIAGNOSIS — E7800 Pure hypercholesterolemia, unspecified: Secondary | ICD-10-CM | POA: Diagnosis not present

## 2016-08-09 ENCOUNTER — Other Ambulatory Visit: Payer: Self-pay | Admitting: Obstetrics and Gynecology

## 2016-08-09 ENCOUNTER — Other Ambulatory Visit (HOSPITAL_COMMUNITY)
Admission: RE | Admit: 2016-08-09 | Discharge: 2016-08-09 | Disposition: A | Discharge status: Home / Self Care | Payer: BLUE CROSS/BLUE SHIELD | Source: Ambulatory Visit | Attending: Obstetrics and Gynecology | Admitting: Obstetrics and Gynecology

## 2016-08-09 DIAGNOSIS — E2839 Other primary ovarian failure: Secondary | ICD-10-CM | POA: Diagnosis not present

## 2016-08-09 DIAGNOSIS — Z124 Encounter for screening for malignant neoplasm of cervix: Secondary | ICD-10-CM | POA: Diagnosis not present

## 2016-08-09 DIAGNOSIS — Z1151 Encounter for screening for human papillomavirus (HPV): Secondary | ICD-10-CM | POA: Insufficient documentation

## 2016-08-09 DIAGNOSIS — N904 Leukoplakia of vulva: Secondary | ICD-10-CM | POA: Diagnosis not present

## 2016-08-09 DIAGNOSIS — N94819 Vulvodynia, unspecified: Secondary | ICD-10-CM | POA: Diagnosis not present

## 2016-08-11 LAB — CERVICOVAGINAL ANCILLARY ONLY: HPV: NOT DETECTED

## 2016-08-25 DIAGNOSIS — N762 Acute vulvitis: Secondary | ICD-10-CM | POA: Diagnosis not present

## 2016-09-19 DIAGNOSIS — M8588 Other specified disorders of bone density and structure, other site: Secondary | ICD-10-CM | POA: Diagnosis not present

## 2016-09-19 DIAGNOSIS — E2839 Other primary ovarian failure: Secondary | ICD-10-CM | POA: Diagnosis not present

## 2016-10-04 DIAGNOSIS — M8588 Other specified disorders of bone density and structure, other site: Secondary | ICD-10-CM | POA: Diagnosis not present

## 2017-01-16 DIAGNOSIS — G500 Trigeminal neuralgia: Secondary | ICD-10-CM | POA: Diagnosis not present

## 2017-04-19 DIAGNOSIS — Z23 Encounter for immunization: Secondary | ICD-10-CM | POA: Diagnosis not present

## 2017-04-28 ENCOUNTER — Other Ambulatory Visit: Payer: Self-pay | Admitting: Family Medicine

## 2017-04-28 DIAGNOSIS — Z1231 Encounter for screening mammogram for malignant neoplasm of breast: Secondary | ICD-10-CM

## 2017-06-12 ENCOUNTER — Ambulatory Visit
Admission: RE | Admit: 2017-06-12 | Discharge: 2017-06-12 | Disposition: A | Discharge status: Home / Self Care | Payer: BLUE CROSS/BLUE SHIELD | Source: Ambulatory Visit | Attending: Family Medicine | Admitting: Family Medicine

## 2017-06-12 DIAGNOSIS — Z1231 Encounter for screening mammogram for malignant neoplasm of breast: Secondary | ICD-10-CM

## 2017-07-10 DIAGNOSIS — R030 Elevated blood-pressure reading, without diagnosis of hypertension: Secondary | ICD-10-CM | POA: Diagnosis not present

## 2017-07-10 DIAGNOSIS — E7800 Pure hypercholesterolemia, unspecified: Secondary | ICD-10-CM | POA: Diagnosis not present

## 2017-07-10 DIAGNOSIS — Z0000 Encounter for general adult medical examination without abnormal findings: Secondary | ICD-10-CM | POA: Diagnosis not present

## 2017-07-25 DIAGNOSIS — C439 Malignant melanoma of skin, unspecified: Secondary | ICD-10-CM

## 2017-07-25 HISTORY — PX: SKIN CANCER EXCISION: SHX779

## 2017-07-25 HISTORY — DX: Malignant melanoma of skin, unspecified: C43.9

## 2017-07-28 DIAGNOSIS — L821 Other seborrheic keratosis: Secondary | ICD-10-CM | POA: Diagnosis not present

## 2017-07-28 DIAGNOSIS — D485 Neoplasm of uncertain behavior of skin: Secondary | ICD-10-CM | POA: Diagnosis not present

## 2017-07-28 DIAGNOSIS — C4362 Malignant melanoma of left upper limb, including shoulder: Secondary | ICD-10-CM | POA: Diagnosis not present

## 2017-07-28 DIAGNOSIS — L918 Other hypertrophic disorders of the skin: Secondary | ICD-10-CM | POA: Diagnosis not present

## 2017-08-24 DIAGNOSIS — C4362 Malignant melanoma of left upper limb, including shoulder: Secondary | ICD-10-CM | POA: Diagnosis not present

## 2017-08-24 DIAGNOSIS — L905 Scar conditions and fibrosis of skin: Secondary | ICD-10-CM | POA: Diagnosis not present

## 2017-09-18 DIAGNOSIS — D2272 Melanocytic nevi of left lower limb, including hip: Secondary | ICD-10-CM | POA: Diagnosis not present

## 2017-09-18 DIAGNOSIS — D2271 Melanocytic nevi of right lower limb, including hip: Secondary | ICD-10-CM | POA: Diagnosis not present

## 2017-09-18 DIAGNOSIS — L814 Other melanin hyperpigmentation: Secondary | ICD-10-CM | POA: Diagnosis not present

## 2017-09-18 DIAGNOSIS — C4362 Malignant melanoma of left upper limb, including shoulder: Secondary | ICD-10-CM | POA: Diagnosis not present

## 2017-10-12 ENCOUNTER — Encounter: Payer: Self-pay | Admitting: Neurology

## 2017-10-30 ENCOUNTER — Other Ambulatory Visit (HOSPITAL_COMMUNITY)
Admission: RE | Admit: 2017-10-30 | Discharge: 2017-10-30 | Disposition: A | Discharge status: Home / Self Care | Payer: BLUE CROSS/BLUE SHIELD | Source: Ambulatory Visit | Attending: Obstetrics and Gynecology | Admitting: Obstetrics and Gynecology

## 2017-10-30 ENCOUNTER — Other Ambulatory Visit: Payer: Self-pay | Admitting: Obstetrics and Gynecology

## 2017-10-30 DIAGNOSIS — Z01411 Encounter for gynecological examination (general) (routine) with abnormal findings: Secondary | ICD-10-CM | POA: Insufficient documentation

## 2017-11-01 LAB — CYTOLOGY - PAP
Diagnosis: NEGATIVE
HPV: NOT DETECTED

## 2017-11-13 ENCOUNTER — Other Ambulatory Visit: Payer: Self-pay | Admitting: Obstetrics and Gynecology

## 2017-11-13 DIAGNOSIS — D260 Other benign neoplasm of cervix uteri: Secondary | ICD-10-CM | POA: Diagnosis not present

## 2017-11-13 DIAGNOSIS — N763 Subacute and chronic vulvitis: Secondary | ICD-10-CM | POA: Diagnosis not present

## 2018-02-12 ENCOUNTER — Encounter: Payer: Self-pay | Admitting: Neurology

## 2018-02-12 ENCOUNTER — Ambulatory Visit: Payer: BLUE CROSS/BLUE SHIELD | Admitting: Neurology

## 2018-02-12 VITALS — BP 130/90 | HR 95 | Ht 63.0 in | Wt 158.0 lb

## 2018-02-12 DIAGNOSIS — G500 Trigeminal neuralgia: Secondary | ICD-10-CM | POA: Diagnosis not present

## 2018-02-12 MED ORDER — OXCARBAZEPINE 150 MG PO TABS: ORAL_TABLET | ORAL | 5 refills | Status: DC

## 2018-02-12 NOTE — Progress Notes (Signed)
[facility] Clinic Note - Initial Visit   Date: 02/12/18  Brittany Newman MRN: [id number] DOB: 03-18-1966   Dear Dr. Moreen Fowler:  Thank you for your kind referral of Brittany Newman for consultation of left trigeminal neuralgia. Although her history is well known to you, please allow [name] to reiterate it for the purpose of our medical record. The patient was accompanied to the clinic by self.  History of Present Illness: Brittany Newman is a 52 y.o. right-handed Caucasian female with hypertension, melanoma of the skin s/p excision (07/2017) and seasonal allergies presenting for evaluation of left facial pain.    Starting in June 2018, she started having stabbing pain radiating into her left jaw especially with tactile stimulation over the lateral jaw/chin.  She saw her PCP who diagnosed her with trigeminal neuralgia and started carbamazepine which helped, but she discontinued this due to rash.  Gabapentin was offered which helped for 3 weeks and higher dose did not help due to cognitive side effects.  She takes sublingual vitamin B12 and TENS unit which reduces the intensity of her pain.    She has shooting pain daily which is aggravated by brushing her teeth, laughing, singing, and tactile stimulus. Pain lasts 5-10 seconds, which can occurring anywhere from 1-2 times per hour and when severe, can occur every few minutes.  Initially, her trigger was af the lateral jaw and over time has moved to the cheek, supraorbital ridge, and now at the left frontal region.  She denies facial weakness, difficulty swallowing or talking.  She is not taking anything currently for pain.   No history of thyroid disease, diabetes, or vitamin B12 deficiency.  She had left upper arm melanoma which was resected in January.   Past Medical History:  Diagnosis Date  . Allergic rhinitis   . Dyslipidemia    mild  . Fibrocystic breast disease   . Melanoma ([place]) 07/2017   left arm  . Plantar  fasciitis    bilateral  . Vulvodynia     Past Surgical History:  Procedure Laterality Date  . SKIN CANCER EXCISION  07/2017   Left upper arm melanoma  . SPLENECTOMY  1994     Medications:  Outpatient Encounter Medications as of 02/12/2018  Medication Sig  . B-12 METHYLCOBALAMIN PO Take by mouth.  . Calcium Carbonate-Vitamin D (CALCIUM 600 + D PO) Take 1 tablet by mouth daily.  . carvedilol (COREG) 3.125 MG tablet   . cetirizine (ZYRTEC) 10 MG tablet Take 5 mg by mouth at bedtime.  . clobetasol cream (TEMOVATE) 0.05 %   . fluticasone (FLONASE) 50 MCG/ACT nasal spray Place 2 sprays into the nose daily.  Marland Kitchen loratadine (CLARITIN) 10 MG tablet Take 5 mg by mouth daily.  . Magnesium 500 MG CAPS Take by mouth.  . medroxyPROGESTERone (DEPO-PROVERA) 150 MG/ML injection Inject 150 mg into the muscle every 3 (three) months.  . Melatonin 1 MG TABS Take 3 mg by mouth at bedtime.  . Multiple Vitamin (MULTIVITAMIN) capsule Take 1 capsule by mouth daily.  Marland Kitchen POTASSIUM PO Take 1 tablet by mouth daily as needed. For cramps  . pseudoephedrine (SUDAFED) 120 MG 12 hr tablet Take 120 mg by mouth every 12 (twelve) hours.  . psyllium (METAMUCIL SMOOTH TEXTURE) 28 % packet Take 1 packet by mouth 2 (two) times daily.  . OXcarbazepine (TRILEPTAL) 150 MG tablet Take one tablet at bedtime x 1 week, then increase to 1 tablet twice daily  . [DISCONTINUED] famotidine (  PEPCID) 20 MG tablet One at bedtime  . [DISCONTINUED] omeprazole (PRILOSEC OTC) 20 MG tablet Take 1 tablet (20 mg total) by mouth daily.   No facility-administered encounter medications on file as of 02/12/2018.      Allergies:  Allergies  Allergen Reactions  . Ibuprofen Hives  . Mepivacaine Hcl Hives  . Sulfa Antibiotics Hives  . Tetracyclines & Related Hives    Family History: Family History  Problem Relation Age of Onset  . Testicular cancer Father   . Prostate cancer Father        with mets to bone  . Liver disease Mother   .  Breast cancer Neg Hx     Social History: Social History   Tobacco Use  . Smoking status: Never Smoker  . Smokeless tobacco: Never Used  Substance Use Topics  . Alcohol use: No  . Drug use: No   Social History   Social History Narrative   She works part-time at [employer] in [employer].   She lives alone.  No children.     Review of Systems:  CONSTITUTIONAL: No fevers, chills, night sweats, or weight loss.   EYES: No visual changes or eye pain ENT: No hearing changes.  No history of nose bleeds.   RESPIRATORY: No cough, wheezing and shortness of breath.   CARDIOVASCULAR: Negative for chest pain, and palpitations.   GI: Negative for abdominal discomfort, blood in stools or black stools.  No recent change in bowel habits.   GU:  No history of incontinence.   MUSCLOSKELETAL: No history of joint pain or swelling.  No myalgias.   SKIN: Negative for lesions, rash, and itching.   HEMATOLOGY/ONCOLOGY: Negative for prolonged bleeding, bruising easily, and swollen nodes.  No history of cancer.   ENDOCRINE: Negative for cold or heat intolerance, polydipsia or goiter.   PSYCH:  No depression or anxiety symptoms.   NEURO: As Above.   Vital Signs:  BP 130/90   Pulse 95   Ht 5\' 3"  (1.6 m)   Wt 158 lb (71.7 kg)   SpO2 97%   BMI 27.99 kg/m    General Medical Exam:   General:  Well appearing, comfortable.   Eyes/ENT: see cranial nerve examination.   Neck: No masses appreciated.  Full range of motion without tenderness.  No carotid bruits. Respiratory:  Clear to auscultation, good air entry bilaterally.   Cardiac:  Regular rate and rhythm, no murmur.   Extremities:  No deformities, edema, or skin discoloration.  Skin:  No rashes or lesions.  Neurological Exam: MENTAL STATUS including orientation to time, place, person, recent and remote memory, attention span and concentration, language, and fund of knowledge is normal.  Speech is not dysarthric.  CRANIAL NERVES: II:   No visual field defects.  Unremarkable fundi.   III-IV-VI: Pupils equal round and reactive to light.  Normal conjugate, extra-ocular eye movements in all directions of gaze.  No nystagmus.  No ptosis.   V:  Normal facial sensation.  Jaw jerk is absent.   VII:  Normal facial symmetry and movements.  No pathologic facial reflexes.  VIII:  Normal hearing and vestibular function.   IX-X:  Normal palatal movement.   XI:  Normal shoulder shrug and head rotation.   XII:  Normal tongue strength and range of motion, no deviation or fasciculation.  MOTOR:  Motor strength is 5/5 throughout.  No atrophy, fasciculations or abnormal movements.  No pronator drift.  Tone is normal.    MSRs:  Right                                                                 Left brachioradialis 2+  brachioradialis 2+  biceps 2+  biceps 2+  triceps 2+  triceps 2+  patellar 2+  patellar 2+  ankle jerk 2+  ankle jerk 2+  Hoffman no  Hoffman no  plantar response down  plantar response down   SENSORY:  Normal and symmetric perception of light touch, pinprick, vibration, and proprioception.  Romberg's sign absent.   COORDINATION/GAIT: Normal finger-to- nose-finger.  Intact rapid alternating movements bilaterally.  Gait narrow based and stable. Tandem and stressed gait intact.    IMPRESSION: Left trigeminal neuralgia with refractory pain.  She has previously tried carbamazepine which was effective, but discontinue due to rash, and gabapentin caused cognitive side effects at low dosage.  Recommend MRI face/trigeminal nerve to evaluate for structural pathology, namely vascular loop causing compression around the nerve.  For pain, I will cautiously start oxcarbamazepine 150mg  at bedtime x 1 week, then increase to 1 tablet twice daily. She was counseled to STOP the medication immediately, if rash develops.  Going forward, consider Lyrica or nortriptyline as options.  Proper lead placement of TENS unit was discussed and she may  continue to use this for relief.   Return to clinic in 4 months   Thank you for allowing me to participate in patient's care.  If I can answer any additional questions, I would be pleased to do so.    Sincerely,    Alyas Creary K. Posey Pronto, DO

## 2018-02-12 NOTE — Patient Instructions (Addendum)
MRI brain with and without contrast  Start oxcarbamazepine 150mg  at bedtime for one week, then increase to 1 tablet twice daily.  Please send me a MyChart message in 4-6 weeks with an update on how you are doing.   Return to clinic in 4 months

## 2018-03-02 ENCOUNTER — Telehealth: Payer: Self-pay | Admitting: Neurology

## 2018-03-02 NOTE — Telephone Encounter (Signed)
Request sent to [facility].

## 2018-03-02 NOTE — Telephone Encounter (Signed)
Dorian Pod called and left a voicemail from [place] stating that if she didn't receive the prior Auth on this patient today 8/9 that she would have to cancelled the scheduled MRIs for tomorrow. We haven't heard back from [place] about this. Thanks.

## 2018-03-03 ENCOUNTER — Other Ambulatory Visit: Payer: BLUE CROSS/BLUE SHIELD

## 2018-03-09 ENCOUNTER — Telehealth: Payer: Self-pay | Admitting: Neurology

## 2018-03-09 ENCOUNTER — Other Ambulatory Visit: Payer: Self-pay | Admitting: *Deleted

## 2018-03-09 DIAGNOSIS — G500 Trigeminal neuralgia: Secondary | ICD-10-CM

## 2018-03-09 NOTE — Telephone Encounter (Signed)
Peer-to-peer done, MRI face denied - cancel this order.   Please order MRI brain wwo contrast - with attention to left trigeminal nerve. Authorization# [id number] valid until 9/12 at [place].   Lafreda Casebeer K. Posey Pronto, DO

## 2018-03-09 NOTE — Telephone Encounter (Signed)
Order sent.

## 2018-03-12 DIAGNOSIS — L814 Other melanin hyperpigmentation: Secondary | ICD-10-CM | POA: Diagnosis not present

## 2018-03-12 DIAGNOSIS — Z85820 Personal history of malignant melanoma of skin: Secondary | ICD-10-CM | POA: Diagnosis not present

## 2018-03-12 DIAGNOSIS — C4362 Malignant melanoma of left upper limb, including shoulder: Secondary | ICD-10-CM | POA: Diagnosis not present

## 2018-03-14 ENCOUNTER — Telehealth: Payer: Self-pay | Admitting: Neurology

## 2018-03-14 NOTE — Telephone Encounter (Signed)
Patient called and left a message stating that her recent rash is gone from the medication. She was wondering what she needed to do next. Please call her back at [phone]. Thanks.

## 2018-03-16 ENCOUNTER — Other Ambulatory Visit: Payer: Self-pay | Admitting: *Deleted

## 2018-03-16 DIAGNOSIS — Z23 Encounter for immunization: Secondary | ICD-10-CM | POA: Diagnosis not present

## 2018-03-16 MED ORDER — PREGABALIN 50 MG PO CAPS: 50.0000 mg | ORAL_CAPSULE | Freq: Two times a day (BID) | ORAL | 5 refills | Status: DC

## 2018-03-16 NOTE — Telephone Encounter (Signed)
Called patient and left message informing her that we would like to start the lyrica.  Rx faxed to [place].

## 2018-03-22 ENCOUNTER — Ambulatory Visit
Admission: RE | Admit: 2018-03-22 | Discharge: 2018-03-22 | Disposition: A | Discharge status: Home / Self Care | Payer: BLUE CROSS/BLUE SHIELD | Source: Ambulatory Visit | Attending: Neurology | Admitting: Neurology

## 2018-03-22 DIAGNOSIS — G500 Trigeminal neuralgia: Secondary | ICD-10-CM | POA: Diagnosis not present

## 2018-03-22 MED ORDER — GADOBENATE DIMEGLUMINE 529 MG/ML IV SOLN
14.0000 mL | Freq: Once | INTRAVENOUS | Status: AC | PRN
  Administered 2018-03-22: 14 mL via INTRAVENOUS

## 2018-03-23 ENCOUNTER — Telehealth: Payer: Self-pay | Admitting: *Deleted

## 2018-03-23 NOTE — Telephone Encounter (Signed)
Patient given the results and is still only taking one 50 mg lyrica a day.  She will start this bid and call me in about a week to let me know how she is doing.

## 2018-03-23 NOTE — Telephone Encounter (Signed)
-----   Message from Alda Berthold, DO sent at 03/22/2018  4:44 PM EDT ----- Please inform patient that her MRI brain looks good - there is a blood vessel that his close to her trigeminal nerve and may contact it, but it does look like there is direct compression or impingement.  No other structural abnormalities are seen.  What dose of Lyrica is she on and is it helping?  We can increase the dose to 75mg  BID, if needed. Thanks.

## 2018-03-29 ENCOUNTER — Other Ambulatory Visit: Payer: Self-pay | Admitting: *Deleted

## 2018-03-29 MED ORDER — PREGABALIN 75 MG PO CAPS: 75.0000 mg | ORAL_CAPSULE | Freq: Every day | ORAL | 5 refills | Status: DC

## 2018-04-16 DIAGNOSIS — Z23 Encounter for immunization: Secondary | ICD-10-CM | POA: Diagnosis not present

## 2018-04-25 MED ORDER — PREGABALIN 75 MG PO CAPS: 75.0000 mg | ORAL_CAPSULE | Freq: Two times a day (BID) | ORAL | 2 refills | Status: DC

## 2018-04-25 NOTE — Addendum Note (Signed)
Addended byAnnamaria Helling on: 04/25/2018 02:05 PM   Modules accepted: Orders

## 2018-04-25 NOTE — Telephone Encounter (Signed)
Can you please send the 75 mg Lyrica RX? Thanks.

## 2018-04-30 ENCOUNTER — Other Ambulatory Visit: Payer: Self-pay | Admitting: Family Medicine

## 2018-04-30 DIAGNOSIS — Z1231 Encounter for screening mammogram for malignant neoplasm of breast: Secondary | ICD-10-CM

## 2018-05-23 DIAGNOSIS — Z23 Encounter for immunization: Secondary | ICD-10-CM | POA: Diagnosis not present

## 2018-06-18 ENCOUNTER — Ambulatory Visit
Admission: RE | Admit: 2018-06-18 | Discharge: 2018-06-18 | Disposition: A | Discharge status: Home / Self Care | Payer: BLUE CROSS/BLUE SHIELD | Source: Ambulatory Visit | Attending: Family Medicine | Admitting: Family Medicine

## 2018-06-18 DIAGNOSIS — Z1231 Encounter for screening mammogram for malignant neoplasm of breast: Secondary | ICD-10-CM | POA: Diagnosis not present

## 2018-06-25 ENCOUNTER — Encounter: Payer: Self-pay | Admitting: Neurology

## 2018-06-25 ENCOUNTER — Ambulatory Visit: Payer: BLUE CROSS/BLUE SHIELD | Admitting: Neurology

## 2018-06-25 VITALS — BP 130/90 | HR 94 | Ht 63.0 in | Wt 173.4 lb

## 2018-06-25 DIAGNOSIS — G500 Trigeminal neuralgia: Secondary | ICD-10-CM

## 2018-06-25 MED ORDER — PREGABALIN 75 MG PO CAPS: 75.0000 mg | ORAL_CAPSULE | Freq: Two times a day (BID) | ORAL | 5 refills | Status: DC

## 2018-06-25 NOTE — Patient Instructions (Signed)
Return to clinic in 6 months.

## 2018-06-25 NOTE — Progress Notes (Signed)
Follow-up Visit   Date: 06/25/18    Brittany Newman MRN: [id number] DOB: 02-May-1966   Interim History: Brittany Newman is a 52 y.o. right-handed Caucasian female with hypertension, melanoma of the skin s/p excision (07/2017) and seasonal allergies returning to the clinic for follow-up of left trigeminal neuralgia.  The patient was accompanied to the clinic by self.  History of present illness: Starting in June 2018, she started having stabbing pain radiating into her left jaw especially with tactile stimulation over the lateral jaw/chin.  She saw her PCP who diagnosed her with trigeminal neuralgia and started carbamazepine which helped, but she discontinued this due to rash.  Gabapentin was offered which helped for 3 weeks and higher dose did not help due to cognitive side effects.  She takes sublingual vitamin B12 and TENS unit which reduces the intensity of her pain.    She has shooting pain daily which is aggravated by brushing her teeth, laughing, singing, and tactile stimulus. Pain lasts 5-10 seconds, which can occurring anywhere from 1-2 times per hour and when severe, can occur every few minutes.  Initially, her trigger was af the lateral jaw and over time has moved to the cheek, supraorbital ridge, and now at the left frontal region.  She denies facial weakness, difficulty swallowing or talking.  She is not taking anything currently for pain.   No history of thyroid disease, diabetes, or vitamin B12 deficiency.  She had left upper arm melanoma which was resected in January.   UPDATE 06/25/2018:  She is here for follow-up visit.  Unfortunately, she developed a rash with oxcarbamazepine so it was stopped. She is taking Lyrica 75mg  twice daily which has helped reduced frequency to 4- 12 times per day, where as previously it was occurring about 1-2 times per hour.  She is now able to chew her teeth.  Yawning, singing, talking, and laughing continue to trigger her pain.  She uses TENS  unit often, which helps.  MRI brain was reviewed which shows close proximity of the left SCA with potential contact. She has gained 15lb since her last visit, and feels it is due to Lyrica.   Medications:  Current Outpatient Medications on File Prior to Visit  Medication Sig Dispense Refill  . B-12 METHYLCOBALAMIN PO Take by mouth.    . Calcium Carbonate-Vitamin D (CALCIUM 600 + D PO) Take 1 tablet by mouth daily.    . carvedilol (COREG) 3.125 MG tablet   0  . cetirizine (ZYRTEC) 10 MG tablet Take 5 mg by mouth at bedtime.    . clobetasol cream (TEMOVATE) 0.05 %   0  . fluticasone (FLONASE) 50 MCG/ACT nasal spray Place 2 sprays into the nose daily.    Marland Kitchen loratadine (CLARITIN) 10 MG tablet Take 5 mg by mouth daily.    . Magnesium 500 MG CAPS Take by mouth.    . medroxyPROGESTERone (DEPO-PROVERA) 150 MG/ML injection Inject 150 mg into the muscle every 3 (three) months.    . Melatonin 1 MG TABS Take 3 mg by mouth at bedtime.    . Multiple Vitamin (MULTIVITAMIN) capsule Take 1 capsule by mouth daily.    . OXcarbazepine (TRILEPTAL) 150 MG tablet Take one tablet at bedtime x 1 week, then increase to 1 tablet twice daily 60 tablet 5  . POTASSIUM PO Take 1 tablet by mouth daily as needed. For cramps    . pregabalin (LYRICA) 75 MG capsule Take 1 capsule (75 mg total) by mouth 2 (  two) times daily. 60 capsule 2  . pseudoephedrine (SUDAFED) 120 MG 12 hr tablet Take 120 mg by mouth every 12 (twelve) hours.    . psyllium (METAMUCIL SMOOTH TEXTURE) 28 % packet Take 1 packet by mouth 2 (two) times daily.     No current facility-administered medications on file prior to visit.     Allergies:  Allergies  Allergen Reactions  . Ibuprofen Hives  . Mepivacaine Hcl Hives  . Sulfa Antibiotics Hives  . Tetracyclines & Related Hives    Review of Systems:  CONSTITUTIONAL: No fevers, chills, night sweats, or weight loss.  EYES: No visual changes or eye pain ENT: No hearing changes.  No history of nose  bleeds.   RESPIRATORY: No cough, wheezing and shortness of breath.   CARDIOVASCULAR: Negative for chest pain, and palpitations.   GI: Negative for abdominal discomfort, blood in stools or black stools.  No recent change in bowel habits.   GU:  No history of incontinence.   MUSCLOSKELETAL: No history of joint pain or swelling.  No myalgias.   SKIN: Negative for lesions, rash, and itching.   ENDOCRINE: Negative for cold or heat intolerance, polydipsia or goiter.   PSYCH:  No depression or anxiety symptoms.   NEURO: As Above.   Vital Signs:  BP 130/90   Pulse 94   Ht 5\' 3"  (1.6 m)   Wt 173 lb 6 oz (78.6 kg)   SpO2 98%   BMI 30.71 kg/m   General Medical Exam:   General:  Well appearing, comfortable  Eyes/ENT: see cranial nerve examination.   Neck:  No carotid bruits. Respiratory:  Clear to auscultation, good air entry bilaterally.   Cardiac:  Regular rate and rhythm, no murmur.   Ext:  No edema  Neurological Exam: MENTAL STATUS including orientation to time, place, person, recent and remote memory, attention span and concentration, language, and fund of knowledge is normal.  Speech is not dysarthric.  CRANIAL NERVES: Pupils equal round and reactive to light.  Normal conjugate, extra-ocular eye movements in all directions of gaze.  No ptosis. Normal facial sensation.  Face is symmetric. Palate elevates symmetrically.  Tongue is midline.  MOTOR:  Motor strength is 5/5 in all extremities.  No pronator drift.  Tone is normal.    MSRs:  Reflexes are 2+/4 throughout  SENSORY:  Intact to vibration  COORDINATION/GAIT:  Normal finger-to- nose-finger.  Intact rapid alternating movements bilaterally.  Gait narrow based and stable.   Data: MRI brain wwo contrat 03/22/2018: 1. No acute intracranial abnormality or mass. 2. Potential left trigeminal nerve root entry zone contact by the superior cerebellar artery.  IMPRESSION/PLAN: Classical left trigeminal neuralgia, better controlled on  Lyrica 75mg  twice daily. She continues to have daily pain episodes which is less in frequency.  I offered to increase Lyrica to 75mg  in the morning and 100mg  at bedtime, but she would like to consider this after her retirement in December and will contact my office.  For now, continue Lyrica 75mg  twice daily.  I personally viewed her MRI brain which shows possible contact by left SCA to the left trigeminal nerve.  We discussed management options, including microvascular decompression if pain becomes refractory to medications.    Return to clinic in 6 months   Thank you for allowing me to participate in patient's care.  If I can answer any additional questions, I would be pleased to do so.    Sincerely,    Lutie Pickler K. Posey Pronto, DO

## 2018-06-26 ENCOUNTER — Encounter: Payer: Self-pay | Admitting: *Deleted

## 2018-07-31 DIAGNOSIS — E7800 Pure hypercholesterolemia, unspecified: Secondary | ICD-10-CM | POA: Diagnosis not present

## 2018-07-31 DIAGNOSIS — N94819 Vulvodynia, unspecified: Secondary | ICD-10-CM | POA: Diagnosis not present

## 2018-07-31 DIAGNOSIS — G500 Trigeminal neuralgia: Secondary | ICD-10-CM | POA: Diagnosis not present

## 2018-07-31 DIAGNOSIS — Z23 Encounter for immunization: Secondary | ICD-10-CM | POA: Diagnosis not present

## 2018-07-31 DIAGNOSIS — Z0000 Encounter for general adult medical examination without abnormal findings: Secondary | ICD-10-CM | POA: Diagnosis not present

## 2018-07-31 DIAGNOSIS — I10 Essential (primary) hypertension: Secondary | ICD-10-CM | POA: Diagnosis not present

## 2018-08-07 DIAGNOSIS — R945 Abnormal results of liver function studies: Secondary | ICD-10-CM | POA: Diagnosis not present

## 2018-08-22 DIAGNOSIS — R945 Abnormal results of liver function studies: Secondary | ICD-10-CM | POA: Diagnosis not present

## 2018-08-28 ENCOUNTER — Other Ambulatory Visit: Payer: Self-pay | Admitting: Family Medicine

## 2018-08-28 DIAGNOSIS — R7989 Other specified abnormal findings of blood chemistry: Secondary | ICD-10-CM

## 2018-08-28 DIAGNOSIS — R945 Abnormal results of liver function studies: Principal | ICD-10-CM

## 2018-09-03 ENCOUNTER — Ambulatory Visit
Admission: RE | Admit: 2018-09-03 | Discharge: 2018-09-03 | Disposition: A | Discharge status: Home / Self Care | Payer: BLUE CROSS/BLUE SHIELD | Source: Ambulatory Visit | Attending: Family Medicine | Admitting: Family Medicine

## 2018-09-03 DIAGNOSIS — R945 Abnormal results of liver function studies: Principal | ICD-10-CM

## 2018-09-03 DIAGNOSIS — R7989 Other specified abnormal findings of blood chemistry: Secondary | ICD-10-CM

## 2018-09-20 DIAGNOSIS — K824 Cholesterolosis of gallbladder: Secondary | ICD-10-CM | POA: Diagnosis not present

## 2018-09-20 DIAGNOSIS — R740 Nonspecific elevation of levels of transaminase and lactic acid dehydrogenase [LDH]: Secondary | ICD-10-CM | POA: Diagnosis not present

## 2018-10-08 DIAGNOSIS — L821 Other seborrheic keratosis: Secondary | ICD-10-CM | POA: Diagnosis not present

## 2018-10-08 DIAGNOSIS — C4362 Malignant melanoma of left upper limb, including shoulder: Secondary | ICD-10-CM | POA: Diagnosis not present

## 2018-10-08 DIAGNOSIS — D2271 Melanocytic nevi of right lower limb, including hip: Secondary | ICD-10-CM | POA: Diagnosis not present

## 2018-10-08 DIAGNOSIS — D2272 Melanocytic nevi of left lower limb, including hip: Secondary | ICD-10-CM | POA: Diagnosis not present

## 2018-11-29 ENCOUNTER — Encounter: Payer: Self-pay | Admitting: Neurology

## 2018-11-29 ENCOUNTER — Other Ambulatory Visit: Payer: Self-pay

## 2018-11-29 ENCOUNTER — Telehealth (INDEPENDENT_AMBULATORY_CARE_PROVIDER_SITE_OTHER): Payer: BLUE CROSS/BLUE SHIELD | Admitting: Neurology

## 2018-11-29 VITALS — BP 131/88 | HR 80 | Ht 63.0 in | Wt 167.0 lb

## 2018-11-29 DIAGNOSIS — G500 Trigeminal neuralgia: Secondary | ICD-10-CM | POA: Diagnosis not present

## 2018-11-29 MED ORDER — PREGABALIN 75 MG PO CAPS: 75.0000 mg | ORAL_CAPSULE | Freq: Two times a day (BID) | ORAL | 5 refills | Status: DC

## 2018-11-29 NOTE — Progress Notes (Signed)
   Virtual Visit via Video Note The purpose of this virtual visit is to provide medical care while limiting exposure to the novel coronavirus.    Consent was obtained for video visit:  Yes.   Answered questions that patient had about telehealth interaction:  Yes.   I discussed the limitations, risks, security and privacy concerns of performing an evaluation and management service by telemedicine. I also discussed with the patient that there may be a patient responsible charge related to this service. The patient expressed understanding and agreed to proceed.  Pt location: Home Physician Location: office Name of referring provider:  Antony Contras, MD I connected with Chriss Czar [place] at patients initiation/request on 11/29/2018 at 10:00 AM EDT by video enabled telemedicine application and verified that I am speaking with the correct person using two identifiers. Pt MRN:  [id number] Pt DOB:  September 12, 1965 Video Participants:  Chriss Czar [place]   History of Present Illness: This is a 53 y.o. female returning for follow-up of left trigeminal neuralgia.  She is doing well on Lyrica 75mg  twice daily and reports spells of pain has significantly improved, only occurring 2-4 times per week, last a few seconds.  She is able to chew on the left side again.  Occasionally, brushing her teeth on the left tends to trigger pain.  No new neurological complaints.    Observations/Objective:   Vitals:   11/29/18 0909  BP: 131/88  Pulse: 80  Weight: 167 lb (75.8 kg)  Height: 5\' 3"  (1.6 m)   Patient is awake, alert, and appears comfortable.  Oriented x 4.   Extraocular muscles are intact. No ptosis.  Face is symmetric.  Speech is not dysarthric. Tongue is midline. Antigravity in all extremities.  No pronator drift. Gait appears normal.   Assessment and Plan:  Classical left trigeminal neuralgia with vascular contact by SCA at the nerve root entry, clinically stable. Pain is well-controlled on Lyrica 75mg   twice daily which will be continued.  Refills provided for 6 months.    Follow Up Instructions:   I discussed the assessment and treatment plan with the patient. The patient was provided an opportunity to ask questions and all were answered. The patient agreed with the plan and demonstrated an understanding of the instructions.   The patient was advised to call back or seek an in-person evaluation if the symptoms worsen or if the condition fails to improve as anticipated.  Follow-up in 6 months   Alda Berthold, DO

## 2018-12-24 HISTORY — PX: CHOLECYSTECTOMY: SHX55

## 2018-12-28 ENCOUNTER — Ambulatory Visit: Payer: BLUE CROSS/BLUE SHIELD | Admitting: Neurology

## 2019-01-08 DIAGNOSIS — Z1159 Encounter for screening for other viral diseases: Secondary | ICD-10-CM | POA: Diagnosis not present

## 2019-01-11 ENCOUNTER — Other Ambulatory Visit: Payer: Self-pay | Admitting: General Surgery

## 2019-01-11 DIAGNOSIS — K824 Cholesterolosis of gallbladder: Secondary | ICD-10-CM | POA: Diagnosis not present

## 2019-01-11 DIAGNOSIS — K811 Chronic cholecystitis: Secondary | ICD-10-CM | POA: Diagnosis not present

## 2019-01-11 DIAGNOSIS — K760 Fatty (change of) liver, not elsewhere classified: Secondary | ICD-10-CM | POA: Diagnosis not present

## 2019-02-13 DIAGNOSIS — R945 Abnormal results of liver function studies: Secondary | ICD-10-CM | POA: Diagnosis not present

## 2019-03-18 DIAGNOSIS — R945 Abnormal results of liver function studies: Secondary | ICD-10-CM | POA: Diagnosis not present

## 2019-03-18 DIAGNOSIS — R7989 Other specified abnormal findings of blood chemistry: Secondary | ICD-10-CM | POA: Diagnosis not present

## 2019-04-16 DIAGNOSIS — C4362 Malignant melanoma of left upper limb, including shoulder: Secondary | ICD-10-CM | POA: Diagnosis not present

## 2019-04-16 DIAGNOSIS — D2272 Melanocytic nevi of left lower limb, including hip: Secondary | ICD-10-CM | POA: Diagnosis not present

## 2019-04-16 DIAGNOSIS — L821 Other seborrheic keratosis: Secondary | ICD-10-CM | POA: Diagnosis not present

## 2019-04-16 DIAGNOSIS — D2271 Melanocytic nevi of right lower limb, including hip: Secondary | ICD-10-CM | POA: Diagnosis not present

## 2019-04-19 MED ORDER — PREGABALIN 75 MG PO CAPS: 75.0000 mg | ORAL_CAPSULE | Freq: Three times a day (TID) | ORAL | 2 refills | Status: DC

## 2019-05-03 ENCOUNTER — Telehealth: Payer: Self-pay | Admitting: Neurology

## 2019-05-03 NOTE — Telephone Encounter (Signed)
Called patient and left a message to call back and reschedule her cancelled appointment with Dr. Posey Pronto that was on 06/03/2019 for a 6 month follow up.

## 2019-05-15 ENCOUNTER — Other Ambulatory Visit: Payer: Self-pay

## 2019-05-15 ENCOUNTER — Telehealth (INDEPENDENT_AMBULATORY_CARE_PROVIDER_SITE_OTHER): Payer: BC Managed Care – PPO | Admitting: Neurology

## 2019-05-15 ENCOUNTER — Encounter: Payer: Self-pay | Admitting: Neurology

## 2019-05-15 VITALS — BP 128/89 | Ht 63.0 in | Wt 162.0 lb

## 2019-05-15 DIAGNOSIS — G500 Trigeminal neuralgia: Secondary | ICD-10-CM | POA: Diagnosis not present

## 2019-05-15 MED ORDER — NORTRIPTYLINE HCL 10 MG PO CAPS: ORAL_CAPSULE | ORAL | 3 refills | Status: DC

## 2019-05-15 NOTE — Progress Notes (Signed)
   Virtual Visit via Video Note The purpose of this virtual visit is to provide medical care while limiting exposure to the novel coronavirus.    Consent was obtained for video visit:  Yes.   Answered questions that patient had about telehealth interaction:  Yes.   I discussed the limitations, risks, security and privacy concerns of performing an evaluation and management service by telemedicine. I also discussed with the patient that there may be a patient responsible charge related to this service. The patient expressed understanding and agreed to proceed.  Pt location: Home Physician Location: office Name of referring provider:  Antony Contras, MD I connected with Chriss Czar Diosdado at patients initiation/request on 05/15/2019 at 10:30 AM EDT by video enabled telemedicine application and verified that I am speaking with the correct person using two identifiers. Pt MRN:  [id number] Pt DOB:  04-07-1966 Video Participants:  Chriss Czar [place]   History of Present Illness: This is a 53 y.o. female returning for follow-up of left trigeminal neuralgia.  She currently takes Lyrica 75mg  three times daily.  Despite this, she continues to have spells of pain which as become more frequent over the past two months.  Spells now occur 2-4 times per hour, where as previously it was 2-4 times per week.  She has noticed that rolling on that side of her face when sleeping triggers her pain, as does laughing and chewing.  Observations/Objective:   Vitals:   05/15/19 0834  BP: 128/89  Weight: 162 lb (73.5 kg)  Height: 5\' 3"  (1.6 m)   Patient is awake, alert, and appears comfortable.  Oriented x 4.   Extraocular muscles are intact. No ptosis.  Face is symmetric.  Speech is not dysarthric. Tongue is midline. Antigravity in all extremities.  No pronator drift. Gait appears normal.   Assessment and Plan:  Classical left trigeminal neuralgia with vascular contact by SCA at the nerve root entry, worse. She has  previously tried:  Carbamazepine (rash), oxcarbamazepine (rash), gabapentin (cognitive side effects) Continue Lyrica 75mg  three times daily - unable to titrate due to cognitive side effects Start nortriptyline 10mg  at bedtime x 2 weeks, then increase to 20mg  at bedtime   Follow Up Instructions:   I discussed the assessment and treatment plan with the patient. The patient was provided an opportunity to ask questions and all were answered. The patient agreed with the plan and demonstrated an understanding of the instructions.   The patient was advised to call back or seek an in-person evaluation if the symptoms worsen or if the condition fails to improve as anticipated.  Follow-up in 4 months  Total time spent:  15 min  [place], DO

## 2019-06-03 ENCOUNTER — Ambulatory Visit: Payer: BLUE CROSS/BLUE SHIELD | Admitting: Neurology

## 2019-06-17 ENCOUNTER — Other Ambulatory Visit: Payer: Self-pay | Admitting: Family Medicine

## 2019-06-17 DIAGNOSIS — Z1231 Encounter for screening mammogram for malignant neoplasm of breast: Secondary | ICD-10-CM

## 2019-07-16 ENCOUNTER — Encounter: Payer: Self-pay | Admitting: Obstetrics and Gynecology

## 2019-07-16 ENCOUNTER — Other Ambulatory Visit: Payer: Self-pay

## 2019-07-16 ENCOUNTER — Ambulatory Visit: Payer: BC Managed Care – PPO | Admitting: Obstetrics and Gynecology

## 2019-07-16 VITALS — BP 157/107 | HR 99 | Ht 63.0 in | Wt 168.9 lb

## 2019-07-16 DIAGNOSIS — L900 Lichen sclerosus et atrophicus: Secondary | ICD-10-CM | POA: Diagnosis not present

## 2019-07-16 NOTE — Progress Notes (Signed)
HPI:      Brittany Newman is a 53 y.o. No obstetric history on file. who LMP was No LMP recorded. Patient is postmenopausal.  Subjective:   She presents today with complaint of labial changes-specifically fusion of her labia.  She is concerned that this is getting worse.  She recently had an episode where she tore part of the labia apart and had open raw areas where it had torn. She states that she previously has used clobetasol cream because of the suspicion of lichen sclerosis although she had biopsies at that time that did not evidence LS. She denies vulvar itching or burning. Of significant note she is not sexually active and has no concerns regarding future intercourse.    Hx: The following portions of the patient's history were reviewed and updated as appropriate:             She  has a past medical history of Allergic rhinitis, Dyslipidemia, Fibrocystic breast disease, Melanoma ([place]) (07/2017), Plantar fasciitis, and Vulvodynia. She does not have any pertinent problems on file. She  has a past surgical history that includes Splenectomy (1994); Skin cancer excision (07/2017); and Cholecystectomy (12/2018). Her family history includes Liver disease in her mother; Prostate cancer in her father; Testicular cancer in her father. She  reports that she has never smoked. She has never used smokeless tobacco. She reports that she does not drink alcohol or use drugs. She has a current medication list which includes the following prescription(s): calcium carb-cholecalciferol, cetirizine, clobetasol cream, fluticasone, magnesium, melatonin, multivitamin, nortriptyline, potassium, pregabalin, pseudoephedrine, and psyllium. She is allergic to ibuprofen; mepivacaine hcl; oxcarbazepine; sulfa antibiotics; tetracyclines & related; and carbamazepine.       Review of Systems:  Review of Systems  Constitutional: Denied constitutional symptoms, night sweats, recent illness, fatigue, fever, insomnia and  weight loss.  Eyes: Denied eye symptoms, eye pain, photophobia, vision change and visual disturbance.  Ears/Nose/Throat/Neck: Denied ear, nose, throat or neck symptoms, hearing loss, nasal discharge, sinus congestion and sore throat.  Cardiovascular: Denied cardiovascular symptoms, arrhythmia, chest pain/pressure, edema, exercise intolerance, orthopnea and palpitations.  Respiratory: Denied pulmonary symptoms, asthma, pleuritic pain, productive sputum, cough, dyspnea and wheezing.  Gastrointestinal: Denied, gastro-esophageal reflux, melena, nausea and vomiting.  Genitourinary: See HPI for additional information.  Musculoskeletal: Denied musculoskeletal symptoms, stiffness, swelling, muscle weakness and myalgia.  Dermatologic: Denied dermatology symptoms, rash and scar.  Neurologic: Denied neurology symptoms, dizziness, headache, neck pain and syncope.  Psychiatric: Denied psychiatric symptoms, anxiety and depression.  Endocrine: Denied endocrine symptoms including hot flashes and night sweats.   Meds:   Current Outpatient Medications on File Prior to Visit  Medication Sig Dispense Refill  . Calcium Carbonate-Vitamin D (CALCIUM 600 + D PO) Take 1 tablet by mouth daily.    . cetirizine (ZYRTEC) 10 MG tablet Take 5 mg by mouth at bedtime.    . clobetasol cream (TEMOVATE) 0.05 %   0  . fluticasone (FLONASE) 50 MCG/ACT nasal spray Place 2 sprays into the nose daily.    . Magnesium 500 MG CAPS Take by mouth.    . Melatonin 1 MG TABS Take 3 mg by mouth at bedtime.    . Multiple Vitamin (MULTIVITAMIN) capsule Take 1 capsule by mouth daily.    . nortriptyline (PAMELOR) 10 MG capsule Start nortriptyline 10mg  at bedtime for 2 week, then increase to 2 tablet at bedtime 60 capsule 3  . POTASSIUM PO Take 1 tablet by mouth daily as needed. For cramps    .  pregabalin (LYRICA) 75 MG capsule Take 1 capsule (75 mg total) by mouth 3 (three) times daily. 90 capsule 2  . pseudoephedrine (SUDAFED) 120 MG 12 hr  tablet Take 120 mg by mouth every 12 (twelve) hours.    . psyllium (METAMUCIL SMOOTH TEXTURE) 28 % packet Take 1 packet by mouth 2 (two) times daily.     No current facility-administered medications on file prior to visit.    Objective:     Vitals:   07/16/19 1534  BP: (!) 157/107  Pulse: 99              Physical examination   Pelvic:   Vulva:  Loss of labial architecture along right labia.  Fusion of anterior right and left labia approximately 3 cm in length  Vagina: No lesions or abnormalities noted.  Posterior skin bridge noted  Support: Normal pelvic support.  Urethra No masses tenderness or scarring.  Meatus Normal size without lesions or prolapse.  Located near area of fused labia    Assessment:    No obstetric history on file. Patient Active Problem List   Diagnosis Date Noted  . Left-sided trigeminal neuralgia 02/12/2018  . Dyspnea 09/26/2011  . Cough 09/08/2011     1. Lichen sclerosus     Causing loss of architecture and fusion of labia   Plan:            1.  Patient to begin clobetasol use twice daily for 1 month.  Will reexamine at that time and consider additional steroid cream as needed. Orders No orders of the defined types were placed in this encounter.   No orders of the defined types were placed in this encounter.     F/U  Return in about 4 weeks (around 08/13/2019). I spent 22 minutes involved in the care of this patient of which greater than 50% was spent discussing lichen sclerosus, labial fusion, loss of labia, effect on urethra, atrophic vaginitis.  All questions answered.  Finis Bud, M.D. 07/16/2019 4:05 PM

## 2019-07-22 ENCOUNTER — Other Ambulatory Visit: Payer: Self-pay | Admitting: Neurology

## 2019-08-08 ENCOUNTER — Other Ambulatory Visit: Payer: Self-pay

## 2019-08-08 ENCOUNTER — Ambulatory Visit
Admission: RE | Admit: 2019-08-08 | Discharge: 2019-08-08 | Disposition: A | Discharge status: Home / Self Care | Payer: BC Managed Care – PPO | Source: Ambulatory Visit | Attending: Family Medicine | Admitting: Family Medicine

## 2019-08-08 DIAGNOSIS — Z1231 Encounter for screening mammogram for malignant neoplasm of breast: Secondary | ICD-10-CM

## 2019-08-13 ENCOUNTER — Encounter: Payer: Self-pay | Admitting: Obstetrics and Gynecology

## 2019-08-13 ENCOUNTER — Other Ambulatory Visit: Payer: Self-pay

## 2019-08-13 ENCOUNTER — Ambulatory Visit: Payer: BC Managed Care – PPO | Admitting: Obstetrics and Gynecology

## 2019-08-13 VITALS — BP 156/112 | HR 99 | Ht 63.0 in | Wt 167.0 lb

## 2019-08-13 DIAGNOSIS — L900 Lichen sclerosus et atrophicus: Secondary | ICD-10-CM

## 2019-08-13 NOTE — Progress Notes (Signed)
HPI:      [name] is a 54 y.o. No obstetric history on file. who LMP was No LMP recorded. Patient is postmenopausal.  Subjective:   She presents today for follow-up of lichen sclerosus.  She has been using clobetasol twice a day.  She reports that she no longer has any irritation or problems externally.  She reports that her anterior labia are still fused but are causing no issues at this time.    Hx: The following portions of the patient's history were reviewed and updated as appropriate:             She  has a past medical history of Allergic rhinitis, Dyslipidemia, Fibrocystic breast disease, Melanoma ([place]) (07/2017), Plantar fasciitis, and Vulvodynia. She does not have any pertinent problems on file. She  has a past surgical history that includes Splenectomy (1994); Skin cancer excision (07/2017); and Cholecystectomy (12/2018). Her family history includes Liver disease in her mother; Prostate cancer in her father; Testicular cancer in her father. She  reports that she has never smoked. She has never used smokeless tobacco. She reports that she does not drink alcohol or use drugs. She has a current medication list which includes the following prescription(s): calcium carb-cholecalciferol, cetirizine, clobetasol cream, fluticasone, magnesium, melatonin, multivitamin, nortriptyline, potassium, pregabalin, pseudoephedrine, and psyllium. She is allergic to ibuprofen; mepivacaine hcl; oxcarbazepine; sulfa antibiotics; tetracyclines & related; and carbamazepine.       Review of Systems:  Review of Systems  Constitutional: Denied constitutional symptoms, night sweats, recent illness, fatigue, fever, insomnia and weight loss.  Eyes: Denied eye symptoms, eye pain, photophobia, vision change and visual disturbance.  Ears/Nose/Throat/Neck: Denied ear, nose, throat or neck symptoms, hearing loss, nasal discharge, sinus congestion and sore throat.  Cardiovascular: Denied cardiovascular  symptoms, arrhythmia, chest pain/pressure, edema, exercise intolerance, orthopnea and palpitations.  Respiratory: Denied pulmonary symptoms, asthma, pleuritic pain, productive sputum, cough, dyspnea and wheezing.  Gastrointestinal: Denied, gastro-esophageal reflux, melena, nausea and vomiting.  Genitourinary: Denied genitourinary symptoms including symptomatic vaginal discharge, pelvic relaxation issues, and urinary complaints.  Musculoskeletal: Denied musculoskeletal symptoms, stiffness, swelling, muscle weakness and myalgia.  Dermatologic: Denied dermatology symptoms, rash and scar.  Neurologic: Denied neurology symptoms, dizziness, headache, neck pain and syncope.  Psychiatric: Denied psychiatric symptoms, anxiety and depression.  Endocrine: Denied endocrine symptoms including hot flashes and night sweats.   Meds:   Current Outpatient Medications on File Prior to Visit  Medication Sig Dispense Refill  . Calcium Carbonate-Vitamin D (CALCIUM 600 + D PO) Take 1 tablet by mouth daily.    . cetirizine (ZYRTEC) 10 MG tablet Take 5 mg by mouth at bedtime.    . clobetasol cream (TEMOVATE) 0.05 %   0  . fluticasone (FLONASE) 50 MCG/ACT nasal spray Place 2 sprays into the nose daily.    . Magnesium 500 MG CAPS Take by mouth.    . Melatonin 1 MG TABS Take 3 mg by mouth at bedtime.    . Multiple Vitamin (MULTIVITAMIN) capsule Take 1 capsule by mouth daily.    . nortriptyline (PAMELOR) 10 MG capsule Start nortriptyline 10mg  at bedtime for 2 week, then increase to 2 tablet at bedtime 60 capsule 3  . POTASSIUM PO Take 1 tablet by mouth daily as needed. For cramps    . pregabalin (LYRICA) 75 MG capsule TAKE 1 CAPSULE BY MOUTH THREE TIMES DAILY 90 capsule 0  . pseudoephedrine (SUDAFED) 120 MG 12 hr tablet Take 120 mg by mouth every 12 (twelve) hours.    Marland Kitchen  psyllium (METAMUCIL SMOOTH TEXTURE) 28 % packet Take 1 packet by mouth 2 (two) times daily.     No current facility-administered medications on file  prior to visit.    Objective:     Vitals:   08/13/19 1354  BP: (!) 156/112  Pulse: 99              Physical examination   Pelvic:  Vulva:  Fused anterior labia about one third  Vagina: No lesions or abnormalities noted.  Erythematous hymeneal ring  Support: Normal pelvic support.  Urethra No masses tenderness or scarring.  Meatus Normal size without lesions or prolapse.  Cervix: Normal appearance.  No lesions.  Anus: Normal exam.  No lesions.  Perineum: Normal exam.  No lesions.     Assessment:    No obstetric history on file. Patient Active Problem List   Diagnosis Date Noted  . Left-sided trigeminal neuralgia 02/12/2018  . Dyspnea 09/26/2011  . Cough 09/08/2011     1. Lichen sclerosus     Inflammatory reaction and white skin changes externally resolved  Some erythema present at the hymeneal ring  Anterior labial fusion remains  Plan:            1.  Space out clobetasol to 2 times per week  2.  Include hymeneal ring and use of clobetasol  3.  If continued fusion of labia occurs or patient becomes symptomatic consider surgical separation-discussed with patient and she is not interested at this time.  Orders No orders of the defined types were placed in this encounter.   No orders of the defined types were placed in this encounter.     F/U  Return for Annual Physical. I spent 20 minutes involved in the care of this patient preparing to see the patient by obtaining and reviewing her medical history (including labs, imaging tests and prior procedures), documenting clinical information in the electronic health record (EHR), writing and sending prescriptions, ordering tests or procedures and directly communicating with the patient discussing pertinent items from her history and physical exam as well as my assessment and plan as noted above.  All of her questions were answered.  Finis Bud, M.D. 08/13/2019 2:24 PM

## 2019-08-22 ENCOUNTER — Other Ambulatory Visit: Payer: Self-pay | Admitting: Neurology

## 2019-08-22 DIAGNOSIS — G500 Trigeminal neuralgia: Secondary | ICD-10-CM | POA: Diagnosis not present

## 2019-08-22 DIAGNOSIS — Z0000 Encounter for general adult medical examination without abnormal findings: Secondary | ICD-10-CM | POA: Diagnosis not present

## 2019-08-22 DIAGNOSIS — I10 Essential (primary) hypertension: Secondary | ICD-10-CM | POA: Diagnosis not present

## 2019-08-22 DIAGNOSIS — E7800 Pure hypercholesterolemia, unspecified: Secondary | ICD-10-CM | POA: Diagnosis not present

## 2019-08-22 DIAGNOSIS — N94819 Vulvodynia, unspecified: Secondary | ICD-10-CM | POA: Diagnosis not present

## 2019-08-26 ENCOUNTER — Telehealth (INDEPENDENT_AMBULATORY_CARE_PROVIDER_SITE_OTHER): Payer: BC Managed Care – PPO | Admitting: Neurology

## 2019-08-26 ENCOUNTER — Other Ambulatory Visit: Payer: Self-pay

## 2019-08-26 DIAGNOSIS — G500 Trigeminal neuralgia: Secondary | ICD-10-CM | POA: Diagnosis not present

## 2019-08-26 MED ORDER — NORTRIPTYLINE HCL 10 MG PO CAPS: 20.0000 mg | ORAL_CAPSULE | Freq: Every day | ORAL | 3 refills | Status: DC

## 2019-08-26 MED ORDER — PREGABALIN 75 MG PO CAPS: 75.0000 mg | ORAL_CAPSULE | Freq: Three times a day (TID) | ORAL | 1 refills | Status: DC

## 2019-08-26 NOTE — Progress Notes (Signed)
   Virtual Visit via Video Note The purpose of this virtual visit is to provide medical care while limiting exposure to the novel coronavirus.    Consent was obtained for video visit:  Yes.   Answered questions that patient had about telehealth interaction:  Yes.   I discussed the limitations, risks, security and privacy concerns of performing an evaluation and management service by telemedicine. I also discussed with the patient that there may be a patient responsible charge related to this service. The patient expressed understanding and agreed to proceed.  Pt location: Home Physician Location: office Name of referring provider:  Antony Contras, MD I connected with Chriss Czar Jenniges at patients initiation/request on 08/26/2019 at  1:30 PM EST by video enabled telemedicine application and verified that I am speaking with the correct person using two identifiers. Pt MRN:  [id number] Pt DOB:  1966-04-12 Video Participants:  Chriss Czar Szymanowski   History of Present Illness: This is a 54 y.o. female returning for follow-up of left trigeminal neuralgia.  At her last visit, I added nortriptyline 10mg  which she titrated to 20mg  at bedtime, in addition to taking Lyrica 75mg  TID.  She reports that her left facial pain has improved, such that she is able to brush her teeth and chew easier without exacerbating her pain.  She has left facial pain when driving and laying supine. She continues to have spells of pain daily, but duration of pain is less lasting about 5-8 seconds.    Observations/Objective:   There were no vitals filed for this visit. Patient is awake, alert, and appears comfortable.  Oriented x 4.   Extraocular muscles are intact. No ptosis.  Face is symmetric.  Speech is not dysarthric.    Assessment and Plan:  Classical left trigeminal neuralgia with vascular compression by SCA at the nerve root entry, stable. Previously tried:  Carbamazepine (rash), oxcarbamazepine (rash), gabapentin  (cognitive side effects) Continue Lyrica 75mg  three times daily - unable to titrate due to cognitive side effects Continue nortriptyline 20mg  at bedtime.  If pain gets worse, this can be increase to 30mg  at bedtime.  Follow Up Instructions:   I discussed the assessment and treatment plan with the patient. The patient was provided an opportunity to ask questions and all were answered. The patient agreed with the plan and demonstrated an understanding of the instructions.   The patient was advised to call back or seek an in-person evaluation if the symptoms worsen or if the condition fails to improve as anticipated.  Follow-up in 8 months   Alda Berthold, DO

## 2019-09-20 DIAGNOSIS — R945 Abnormal results of liver function studies: Secondary | ICD-10-CM | POA: Diagnosis not present

## 2019-10-03 DIAGNOSIS — D2272 Melanocytic nevi of left lower limb, including hip: Secondary | ICD-10-CM | POA: Diagnosis not present

## 2019-10-03 DIAGNOSIS — L821 Other seborrheic keratosis: Secondary | ICD-10-CM | POA: Diagnosis not present

## 2019-10-03 DIAGNOSIS — C4362 Malignant melanoma of left upper limb, including shoulder: Secondary | ICD-10-CM | POA: Diagnosis not present

## 2019-10-03 DIAGNOSIS — D2271 Melanocytic nevi of right lower limb, including hip: Secondary | ICD-10-CM | POA: Diagnosis not present

## 2019-12-19 ENCOUNTER — Other Ambulatory Visit: Payer: Self-pay | Admitting: Surgical

## 2019-12-19 MED ORDER — CLOBETASOL PROPIONATE 0.05 % EX CREA: TOPICAL_CREAM | CUTANEOUS | 1 refills | Status: DC

## 2020-03-19 ENCOUNTER — Other Ambulatory Visit: Payer: Self-pay | Admitting: Neurology

## 2020-04-07 DIAGNOSIS — C4362 Malignant melanoma of left upper limb, including shoulder: Secondary | ICD-10-CM | POA: Diagnosis not present

## 2020-04-07 DIAGNOSIS — D2271 Melanocytic nevi of right lower limb, including hip: Secondary | ICD-10-CM | POA: Diagnosis not present

## 2020-04-07 DIAGNOSIS — L821 Other seborrheic keratosis: Secondary | ICD-10-CM | POA: Diagnosis not present

## 2020-04-07 DIAGNOSIS — D2272 Melanocytic nevi of left lower limb, including hip: Secondary | ICD-10-CM | POA: Diagnosis not present

## 2020-04-24 ENCOUNTER — Ambulatory Visit (INDEPENDENT_AMBULATORY_CARE_PROVIDER_SITE_OTHER): Payer: BC Managed Care – PPO | Admitting: Neurology

## 2020-04-24 ENCOUNTER — Encounter: Payer: Self-pay | Admitting: Neurology

## 2020-04-24 ENCOUNTER — Other Ambulatory Visit: Payer: Self-pay

## 2020-04-24 VITALS — BP 135/91 | HR 85 | Ht 63.0 in | Wt 169.0 lb

## 2020-04-24 DIAGNOSIS — G500 Trigeminal neuralgia: Secondary | ICD-10-CM

## 2020-04-24 NOTE — Patient Instructions (Signed)
Return to clinic in 1 year.

## 2020-04-24 NOTE — Progress Notes (Signed)
    Follow-up Visit   Date: 04/24/20   Brittany Newman MRN: [id number] DOB: Aug 26, 1965   Interim History: Brittany Newman is a 54 y.o. female returning to the clinic for follow-up of left trigeminal neuralgia.  The patient was accompanied to the clinic by self.  Her facial pain is stable and well-controlled on Lyrica 75mg  three times daily and nortriptyline 20mg  at bedtime.  She is tolerating the medications well.  She continues to have daily pain, usually in the morning, which lasts a few seconds.     Medications:  Current Outpatient Medications on File Prior to Visit  Medication Sig Dispense Refill  . Calcium Carbonate-Vitamin D (CALCIUM 600 + D PO) Take 1 tablet by mouth daily.    . cetirizine (ZYRTEC) 10 MG tablet Take 5 mg by mouth at bedtime.    . clobetasol cream (TEMOVATE) 0.05 % Apply to area twice a week. 30 g 1  . DILT-XR 120 MG 24 hr capsule Take 120 mg by mouth daily.    . fluticasone (FLONASE) 50 MCG/ACT nasal spray Place 2 sprays into the nose daily.    . Magnesium 500 MG CAPS Take by mouth.    . Melatonin 1 MG TABS Take 3 mg by mouth at bedtime.    . Multiple Vitamin (MULTIVITAMIN) capsule Take 1 capsule by mouth daily.    . nortriptyline (PAMELOR) 10 MG capsule Take 2 capsules (20 mg total) by mouth at bedtime. 180 capsule 3  . POTASSIUM PO Take 1 tablet by mouth daily as needed. For cramps    . pregabalin (LYRICA) 75 MG capsule TAKE 1 CAPSULE BY MOUTH THREE TIMES DAILY 270 capsule 0  . pseudoephedrine (SUDAFED) 120 MG 12 hr tablet Take 120 mg by mouth every 12 (twelve) hours.    . psyllium (METAMUCIL SMOOTH TEXTURE) 28 % packet Take 1 packet by mouth 2 (two) times daily.     No current facility-administered medications on file prior to visit.    Allergies:  Allergies  Allergen Reactions  . Ibuprofen Hives  . Mepivacaine Hcl Hives  . Oxcarbazepine Hives  . Sulfa Antibiotics Hives  . Tetracyclines & Related Hives  . Carbamazepine Rash    Vital Signs:    BP (!) 135/91   Pulse 85   Ht 5\' 3"  (1.6 m)   Wt 169 lb (76.7 kg)   SpO2 98%   BMI 29.94 kg/m    Neurological Exam: MENTAL STATUS including orientation to time, place, person, recent and remote memory, attention span and concentration, language, and fund of knowledge is normal.  Speech is not dysarthric.  CRANIAL NERVES:  Pupils equal round and reactive to light.  Normal conjugate, extra-ocular eye movements in all directions of gaze.  No ptosis.  Face is symmetric.   MOTOR: Antigravity throughout  COORDINATION/GAIT:  Normal finger-to- nose-finger.  Intact rapid alternating movements bilaterally.  Gait narrow based and stable. Tandem gait intact  Data: n/a  IMPRESSION/PLAN: Left trigeminal neuralgia with vascular compression by SCA at nerve root entry, stable.  Symptoms well-controlled on medication.   - Continue Lyrica 75mg  three times daily  - Continue nortriptyline 20mg  at bedtime  Return to clinic in 1 year or sooner as needed.   Thank you for allowing me to participate in patient's care.  If I can answer any additional questions, I would be pleased to do so.    Sincerely,    Maryclare Nydam K. Posey Pronto, DO

## 2020-06-20 ENCOUNTER — Other Ambulatory Visit: Payer: Self-pay | Admitting: Neurology

## 2020-06-24 ENCOUNTER — Other Ambulatory Visit: Payer: Self-pay | Admitting: Family Medicine

## 2020-06-24 DIAGNOSIS — Z1231 Encounter for screening mammogram for malignant neoplasm of breast: Secondary | ICD-10-CM

## 2020-08-11 ENCOUNTER — Ambulatory Visit
Admission: RE | Admit: 2020-08-11 | Discharge: 2020-08-11 | Disposition: A | Discharge status: Home / Self Care | Payer: BC Managed Care – PPO | Source: Ambulatory Visit | Attending: Family Medicine | Admitting: Family Medicine

## 2020-08-11 ENCOUNTER — Other Ambulatory Visit: Payer: Self-pay

## 2020-08-11 DIAGNOSIS — Z1231 Encounter for screening mammogram for malignant neoplasm of breast: Secondary | ICD-10-CM | POA: Diagnosis not present

## 2020-08-12 ENCOUNTER — Ambulatory Visit: Payer: BC Managed Care – PPO | Admitting: Obstetrics and Gynecology

## 2020-08-12 ENCOUNTER — Encounter: Payer: Self-pay | Admitting: Obstetrics and Gynecology

## 2020-08-12 VITALS — BP 136/89 | HR 98 | Ht 63.0 in | Wt 171.2 lb

## 2020-08-12 DIAGNOSIS — Q525 Fusion of labia: Secondary | ICD-10-CM

## 2020-08-12 DIAGNOSIS — L900 Lichen sclerosus et atrophicus: Secondary | ICD-10-CM | POA: Diagnosis not present

## 2020-08-12 MED ORDER — ESTRADIOL 0.1 MG/GM VA CREA: 2.0000 g | TOPICAL_CREAM | Freq: Every day | VAGINAL | 1 refills | Status: DC

## 2020-08-12 NOTE — Progress Notes (Signed)
HPI:      Brittany Newman is a 55 y.o. No obstetric history on file. who LMP was No LMP recorded. Patient is postmenopausal.  Subjective:   She presents today for follow-up of her lichen sclerosus.  She is currently using clobetasol 3 times per week.  She reports that she has no symptoms.  She does state that she thinks her anterior labial fusion is slightly worse than before or at least it is not resolving as hoped.  She does not have any complications from this fusion. She is not currently sexually active.    Hx: The following portions of the patient's history were reviewed and updated as appropriate:             She  has a past medical history of Allergic rhinitis, Dyslipidemia, Fibrocystic breast disease, Melanoma ([place]) (07/2017), Plantar fasciitis, and Vulvodynia. She does not have any pertinent problems on file. She  has a past surgical history that includes Splenectomy (1994); Skin cancer excision (07/2017); and Cholecystectomy (12/2018). Her family history includes Liver disease in her mother; Prostate cancer in her father; Testicular cancer in her father. She  reports that she has never smoked. She has never used smokeless tobacco. She reports that she does not drink alcohol and does not use drugs. She has a current medication list which includes the following prescription(s): calcium carb-cholecalciferol, cetirizine, clobetasol cream, dilt-xr, estradiol, fluticasone, magnesium, melatonin, multivitamin, nortriptyline, potassium, pregabalin, pseudoephedrine, and psyllium. She is allergic to ibuprofen, mepivacaine hcl, oxcarbazepine, sulfa antibiotics, tetracyclines & related, and carbamazepine.       Review of Systems:  Review of Systems  Constitutional: Denied constitutional symptoms, night sweats, recent illness, fatigue, fever, insomnia and weight loss.  Eyes: Denied eye symptoms, eye pain, photophobia, vision change and visual disturbance.  Ears/Nose/Throat/Neck: Denied ear,  nose, throat or neck symptoms, hearing loss, nasal discharge, sinus congestion and sore throat.  Cardiovascular: Denied cardiovascular symptoms, arrhythmia, chest pain/pressure, edema, exercise intolerance, orthopnea and palpitations.  Respiratory: Denied pulmonary symptoms, asthma, pleuritic pain, productive sputum, cough, dyspnea and wheezing.  Gastrointestinal: Denied, gastro-esophageal reflux, melena, nausea and vomiting.  Genitourinary: See HPI for additional information.  Musculoskeletal: Denied musculoskeletal symptoms, stiffness, swelling, muscle weakness and myalgia.  Dermatologic: Denied dermatology symptoms, rash and scar.  Neurologic: Denied neurology symptoms, dizziness, headache, neck pain and syncope.  Psychiatric: Denied psychiatric symptoms, anxiety and depression.  Endocrine: Denied endocrine symptoms including hot flashes and night sweats.   Meds:   Current Outpatient Medications on File Prior to Visit  Medication Sig Dispense Refill  . Calcium Carbonate-Vitamin D (CALCIUM 600 + D PO) Take 1 tablet by mouth daily.    . cetirizine (ZYRTEC) 10 MG tablet Take 5 mg by mouth at bedtime.    . clobetasol cream (TEMOVATE) 0.05 % Apply to area twice a week. 30 g 1  . DILT-XR 120 MG 24 hr capsule Take 120 mg by mouth daily.    . fluticasone (FLONASE) 50 MCG/ACT nasal spray Place 2 sprays into the nose daily.    . Magnesium 500 MG CAPS Take by mouth.    . Melatonin 1 MG TABS Take 3 mg by mouth at bedtime.    . Multiple Vitamin (MULTIVITAMIN) capsule Take 1 capsule by mouth daily.    . nortriptyline (PAMELOR) 10 MG capsule Take 2 capsules (20 mg total) by mouth at bedtime. 180 capsule 3  . POTASSIUM PO Take 1 tablet by mouth daily as needed. For cramps    . pregabalin (LYRICA)  75 MG capsule TAKE 1 CAPSULE BY MOUTH THREE TIMES DAILY 270 capsule 1  . pseudoephedrine (SUDAFED) 120 MG 12 hr tablet Take 120 mg by mouth every 12 (twelve) hours.    . psyllium (METAMUCIL SMOOTH TEXTURE) 28  % packet Take 1 packet by mouth 2 (two) times daily.     No current facility-administered medications on file prior to visit.          Objective:     Vitals:   08/12/20 1411  BP: 136/89  Pulse: 98   Filed Weights   08/12/20 1411  Weight: 171 lb 3.2 oz (77.7 kg)              Physical examination   Pelvic:  Vulva:  Midline, anteriorly there is some labial fusion noted.  No lesions.  Vagina: No lesions or abnormalities noted.  Vaginal atrophy  Support: Normal pelvic support.  Urethra No masses tenderness or scarring.  Meatus Normal size without lesions or prolapse.     Anus: Normal exam.  No lesions.  Perineum: Normal exam.  No lesions.     Assessment:    No obstetric history on file. Patient Active Problem List   Diagnosis Date Noted  . Left-sided trigeminal neuralgia 02/12/2018  . Dyspnea 09/26/2011  . Cough 09/08/2011     1. Lichen sclerosus   2. Labial fusion     Patient currently asymptomatic   Plan:            1.  Continue clobetasol use 2-3 times per week  2. patient desires a trial of estrogen vaginal cream use on the point of fusion.  We have discussed this in some detail and she will try it for 3 months. Orders No orders of the defined types were placed in this encounter.    Meds ordered this encounter  Medications  . estradiol (ESTRACE VAGINAL) 0.1 MG/GM vaginal cream    Sig: Place 2 g vaginally daily. Place a small amount of cream along the line of fusion nightly.    Dispense:  42.5 g    Refill:  1      F/U  No follow-ups on file. I spent 23 minutes involved in the care of this patient preparing to see the patient by obtaining and reviewing her medical history (including labs, imaging tests and prior procedures), documenting clinical information in the electronic health record (EHR), counseling and coordinating care plans, writing and sending prescriptions, ordering tests or procedures and directly communicating with the patient by  discussing pertinent items from her history and physical exam as well as detailing my assessment and plan as noted above so that she has an informed understanding.  All of her questions were answered.  Finis Bud, M.D. 08/12/2020 2:31 PM

## 2020-08-27 DIAGNOSIS — Z0000 Encounter for general adult medical examination without abnormal findings: Secondary | ICD-10-CM | POA: Diagnosis not present

## 2020-08-27 DIAGNOSIS — G500 Trigeminal neuralgia: Secondary | ICD-10-CM | POA: Diagnosis not present

## 2020-08-27 DIAGNOSIS — I10 Essential (primary) hypertension: Secondary | ICD-10-CM | POA: Diagnosis not present

## 2020-08-27 DIAGNOSIS — E7800 Pure hypercholesterolemia, unspecified: Secondary | ICD-10-CM | POA: Diagnosis not present

## 2020-08-27 DIAGNOSIS — K5900 Constipation, unspecified: Secondary | ICD-10-CM | POA: Diagnosis not present

## 2020-09-01 ENCOUNTER — Other Ambulatory Visit: Payer: Self-pay

## 2020-09-01 DIAGNOSIS — Q525 Fusion of labia: Secondary | ICD-10-CM

## 2020-09-01 MED ORDER — ESTRADIOL 0.1 MG/GM VA CREA: 2.0000 g | TOPICAL_CREAM | Freq: Every day | VAGINAL | 1 refills | Status: DC

## 2020-09-02 ENCOUNTER — Other Ambulatory Visit: Payer: Self-pay | Admitting: Neurology

## 2020-09-03 ENCOUNTER — Other Ambulatory Visit: Payer: Self-pay

## 2020-09-03 DIAGNOSIS — Q525 Fusion of labia: Secondary | ICD-10-CM

## 2020-09-03 MED ORDER — ESTRADIOL 0.1 MG/GM VA CREA: 1.0000 | TOPICAL_CREAM | Freq: Every day | VAGINAL | 1 refills | Status: DC

## 2020-10-22 ENCOUNTER — Telehealth: Payer: Self-pay | Admitting: Gastroenterology

## 2020-10-22 NOTE — Telephone Encounter (Signed)
Hey Dr Fuller Plan, this pt is being referred to [name] from [facility] for screening colonoscopy (last done 07/2015 with repeat in 5 years but it the pt had one done in 2017, I will send the report to you for review, please advise on scheduling

## 2020-10-28 ENCOUNTER — Encounter: Payer: Self-pay | Admitting: Gastroenterology

## 2020-10-28 NOTE — Telephone Encounter (Signed)
Per Dr. Fuller Plan ok to schedule direct colon.  Called patient and scheduled her for 01/19/21 and 02/02/21.

## 2020-12-09 ENCOUNTER — Other Ambulatory Visit: Payer: Self-pay | Admitting: Neurology

## 2020-12-09 NOTE — Telephone Encounter (Signed)
Please advise 

## 2021-01-19 ENCOUNTER — Other Ambulatory Visit: Payer: Self-pay

## 2021-01-19 ENCOUNTER — Ambulatory Visit (AMBULATORY_SURGERY_CENTER): Payer: BC Managed Care – PPO | Admitting: *Deleted

## 2021-01-19 VITALS — Ht 63.0 in | Wt 166.0 lb

## 2021-01-19 DIAGNOSIS — Z86010 Personal history of colonic polyps: Secondary | ICD-10-CM

## 2021-01-19 MED ORDER — SUTAB 1479-225-188 MG PO TABS: 1.0000 | ORAL_TABLET | ORAL | 0 refills | Status: DC

## 2021-01-19 NOTE — Progress Notes (Signed)
Patient's pre-visit was done today over the phone with the patient due to COVID-19 pandemic. Name,DOB and address verified. Insurance verified. Patient denies any allergies to Eggs and Soy. Patient denies any problems with anesthesia/sedation. Patient denies taking diet pills or blood thinners. Packet of Prep instructions mailed to patient including a copy of a consent form-pt is aware. Patient understands to call us back with any questions or concerns. Patient is aware of our care-partner policy and TDVVO-16 safety protocol.   EMMI education assigned to the patient for the procedure, sent to [facility].   The patient is COVID-19 vaccinated, per patient. Patient request the prep "pills", she was unable to do the liquid prep last colon. 2 day sutab given due to constipation. Patient aware of the cost and coupon.

## 2021-01-29 ENCOUNTER — Encounter: Payer: Self-pay | Admitting: Gastroenterology

## 2021-02-02 ENCOUNTER — Other Ambulatory Visit: Payer: Self-pay

## 2021-02-02 ENCOUNTER — Encounter: Payer: Self-pay | Admitting: Gastroenterology

## 2021-02-02 ENCOUNTER — Ambulatory Visit (AMBULATORY_SURGERY_CENTER): Payer: BC Managed Care – PPO | Admitting: Gastroenterology

## 2021-02-02 VITALS — BP 116/75 | HR 68 | Temp 97.7°F | Resp 8 | Ht 63.0 in | Wt 166.0 lb

## 2021-02-02 DIAGNOSIS — Z86010 Personal history of colonic polyps: Secondary | ICD-10-CM | POA: Diagnosis not present

## 2021-02-02 DIAGNOSIS — D123 Benign neoplasm of transverse colon: Secondary | ICD-10-CM | POA: Diagnosis not present

## 2021-02-02 DIAGNOSIS — D125 Benign neoplasm of sigmoid colon: Secondary | ICD-10-CM | POA: Diagnosis not present

## 2021-02-02 DIAGNOSIS — Z1211 Encounter for screening for malignant neoplasm of colon: Secondary | ICD-10-CM | POA: Diagnosis not present

## 2021-02-02 DIAGNOSIS — D122 Benign neoplasm of ascending colon: Secondary | ICD-10-CM | POA: Diagnosis not present

## 2021-02-02 MED ORDER — SODIUM CHLORIDE 0.9 % IV SOLN: 500.0000 mL | Freq: Once | INTRAVENOUS | Status: AC

## 2021-02-02 NOTE — Progress Notes (Signed)
pt tolerated well. VSS. awake and to recovery. Report given to RN.  

## 2021-02-02 NOTE — Op Note (Signed)
[facility] Patient Name: Brittany Newman Procedure Date: 02/02/2021 7:26 AM MRN: [id number] Endoscopist: Ladene Artist , MD Age: 55 Referring MD:  Date of Birth: Mar 10, 1966 Gender: Female Account #: [id number] Procedure:                Colonoscopy Indications:              Surveillance: Personal history of adenomatous                            polyps on last colonoscopy 5 years ago Medicines:                Monitored Anesthesia Care Procedure:                Pre-Anesthesia Assessment:                           - Prior to the procedure, a History and Physical                            was performed, and patient medications and                            allergies were reviewed. The patient's tolerance of                            previous anesthesia was also reviewed. The risks                            and benefits of the procedure and the sedation                            options and risks were discussed with the patient.                            All questions were answered, and informed consent                            was obtained. Prior Anticoagulants: The patient has                            taken no previous anticoagulant or antiplatelet                            agents. ASA Grade Assessment: II - A patient with                            mild systemic disease. After reviewing the risks                            and benefits, the patient was deemed in                            satisfactory condition to undergo the procedure.  After obtaining informed consent, the colonoscope                            was passed under direct vision. Throughout the                            procedure, the patient's blood pressure, pulse, and                            oxygen saturations were monitored continuously. The                            CF [id number] #[id number] was introduced through the anus                            and advanced to the  the cecum, identified by                            appendiceal orifice and ileocecal valve. The                            ileocecal valve, appendiceal orifice, and rectum                            were photographed. The quality of the bowel                            preparation was good. The colonoscopy was performed                            without difficulty. The colon was tortuous. The                            patient tolerated the procedure well. Scope In: 8:08:26 AM Scope Out: 8:27:09 AM Scope Withdrawal Time: 0 hours 13 minutes 16 seconds  Total Procedure Duration: 0 hours 18 minutes 43 seconds  Findings:                 The perianal and digital rectal examinations were                            normal.                           Three sessile polyps were found in the sigmoid                            colon, transverse colon and ascending colon. The                            polyps were 5 to 9 mm in size. These polyps were                            removed with a cold snare. Resection and retrieval  were complete.                           A few medium-mouthed diverticula were found in the                            right colon.                           The exam was otherwise without abnormality on                            direct and retroflexion views. Complications:            No immediate complications. Estimated blood loss:                            None. Estimated Blood Loss:     Estimated blood loss: none. Impression:               - Three 5 to 9 mm polyps in the sigmoid colon, in                            the transverse colon and in the ascending colon,                            removed with a cold snare. Resected and retrieved.                           - Mild diverticulosis in the right colon.                           - The examination was otherwise normal on direct                            and retroflexion  views. Recommendation:           - Repeat colonoscopy after studies are complete for                            surveillance based on pathology results.                           - Patient has a contact number available for                            emergencies. The signs and symptoms of potential                            delayed complications were discussed with the                            patient. Return to normal activities tomorrow.                            Written discharge instructions were provided to the  patient.                           - Resume previous diet.                           - Continue present medications.                           - Await pathology results. Ladene Artist, MD 02/02/2021 8:30:30 AM This report has been signed electronically.

## 2021-02-02 NOTE — Progress Notes (Signed)
Vitals by CW. Pt's states no medical or surgical changes since previsit or office visit.

## 2021-02-02 NOTE — Patient Instructions (Signed)
Handouts on polyps & diverticulosis given to you today  Await pathology results on polyps removed     YOU HAD AN ENDOSCOPIC PROCEDURE TODAY AT [facility]:   Refer to the procedure report that was given to you for any specific questions about what was found during the examination.  If the procedure report does not answer your questions, please call your gastroenterologist to clarify.  If you requested that your care partner not be given the details of your procedure findings, then the procedure report has been included in a sealed envelope for you to review at your convenience later.  YOU SHOULD EXPECT: Some feelings of bloating in the abdomen. Passage of more gas than usual.  Walking can help get rid of the air that was put into your GI tract during the procedure and reduce the bloating. If you had a lower endoscopy (such as a colonoscopy or flexible sigmoidoscopy) you may notice spotting of blood in your stool or on the toilet paper. If you underwent a bowel prep for your procedure, you may not have a normal bowel movement for a few days.  Please Note:  You might notice some irritation and congestion in your nose or some drainage.  This is from the oxygen used during your procedure.  There is no need for concern and it should clear up in a day or so.  SYMPTOMS TO REPORT IMMEDIATELY:  Following lower endoscopy (colonoscopy or flexible sigmoidoscopy):  Excessive amounts of blood in the stool  Significant tenderness or worsening of abdominal pains  Swelling of the abdomen that is new, acute  Fever of 100F or higher   For urgent or emergent issues, a gastroenterologist can be reached at any hour by calling [phone]. Do not use MyChart messaging for urgent concerns.    DIET:  We do recommend a small meal at first, but then you may proceed to your regular diet.  Drink plenty of fluids but you should avoid alcoholic beverages for 24 hours.  ACTIVITY:  You should plan  to take it easy for the rest of today and you should NOT DRIVE or use heavy machinery until tomorrow (because of the sedation medicines used during the test).    FOLLOW UP: Our staff will call the number listed on your records 48-72 hours following your procedure to check on you and address any questions or concerns that you may have regarding the information given to you following your procedure. If we do not reach you, we will leave a message.  We will attempt to reach you two times.  During this call, we will ask if you have developed any symptoms of COVID 19. If you develop any symptoms (ie: fever, flu-like symptoms, shortness of breath, cough etc.) before then, please call [phone].  If you test positive for Covid 19 in the 2 weeks post procedure, please call and report this information to [name].    If any biopsies were taken you will be contacted by phone or by letter within the next 1-3 weeks.  Please call us at [phone] if you have not heard about the biopsies in 3 weeks.    SIGNATURES/CONFIDENTIALITY: You and/or your care partner have signed paperwork which will be entered into your electronic medical record.  These signatures attest to the fact that that the information above on your After Visit Summary has been reviewed and is understood.  Full responsibility of the confidentiality of this discharge information lies with you and/or  your care-partner.

## 2021-02-02 NOTE — Progress Notes (Signed)
Called to room to assist during endoscopic procedure.  Patient ID and intended procedure confirmed with present staff. Received instructions for my participation in the procedure from the performing physician.  

## 2021-02-11 ENCOUNTER — Encounter: Payer: Self-pay | Admitting: Gastroenterology

## 2021-03-10 ENCOUNTER — Other Ambulatory Visit: Payer: Self-pay | Admitting: Obstetrics and Gynecology

## 2021-04-08 DIAGNOSIS — D2272 Melanocytic nevi of left lower limb, including hip: Secondary | ICD-10-CM | POA: Diagnosis not present

## 2021-04-08 DIAGNOSIS — L255 Unspecified contact dermatitis due to plants, except food: Secondary | ICD-10-CM | POA: Diagnosis not present

## 2021-04-08 DIAGNOSIS — C4362 Malignant melanoma of left upper limb, including shoulder: Secondary | ICD-10-CM | POA: Diagnosis not present

## 2021-04-08 DIAGNOSIS — L821 Other seborrheic keratosis: Secondary | ICD-10-CM | POA: Diagnosis not present

## 2021-04-08 DIAGNOSIS — D2271 Melanocytic nevi of right lower limb, including hip: Secondary | ICD-10-CM | POA: Diagnosis not present

## 2021-04-26 ENCOUNTER — Encounter: Payer: Self-pay | Admitting: Neurology

## 2021-04-26 ENCOUNTER — Other Ambulatory Visit: Payer: Self-pay

## 2021-04-26 ENCOUNTER — Telehealth (INDEPENDENT_AMBULATORY_CARE_PROVIDER_SITE_OTHER): Payer: BC Managed Care – PPO | Admitting: Neurology

## 2021-04-26 VITALS — BP 134/98 | HR 84 | Wt 163.0 lb

## 2021-04-26 DIAGNOSIS — G500 Trigeminal neuralgia: Secondary | ICD-10-CM | POA: Diagnosis not present

## 2021-04-26 NOTE — Progress Notes (Signed)
   Virtual Visit via Video Note The purpose of this virtual visit is to provide medical care while limiting exposure to the novel coronavirus.    Consent was obtained for video visit:  Yes.   Answered questions that patient had about telehealth interaction:  Yes.   I discussed the limitations, risks, security and privacy concerns of performing an evaluation and management service by telemedicine. I also discussed with the patient that there may be a patient responsible charge related to this service. The patient expressed understanding and agreed to proceed.  Pt location: Home Physician Location: office Name of referring provider:  Antony Contras, MD I connected with Chriss Czar Schlarb at patients initiation/request on 04/26/2021 at  2:30 PM EDT by video enabled telemedicine application and verified that I am speaking with the correct person using two identifiers. Pt MRN:  [id number] Pt DOB:  March 20, 1966 Video Participants:  Chriss Czar Crocket   History of Present Illness: This is a 55 y.o. female returning for follow-up of left trigeminal neuralgia.  She has been compliant taking Lyrica 75mg  three times daily and nortriptyline 20mg  at bedtime.  This regimen adequately control her facial pain.  She does have daily spells of pain occurring about 2-3 times per day, lasting 30-45 seconds. During the spring her pain was better controlled and seemed to get worse after her bowel prep for colonoscopy this summer. She is having dental work this week.  Observations/Objective:   Vitals:   04/26/21 1200  BP: (!) 134/98  Pulse: 84  Weight: 163 lb (73.9 kg)   Patient is awake, alert, and appears comfortable.  Oriented x 4.   Extraocular muscles are intact. No ptosis.  Face is symmetric.  Speech is not dysarthric.  Antigravity in all extremities Gait appears normal   Assessment and Plan:  Classical left trigeminal neuralgia with vascular compression by SCA at the nerve root entry, stable. Previously  tried:  Carbamazepine (rash), oxcarbamazepine (rash), gabapentin (cognitive side effects) Continue Lyrica 75mg  three times daily  Continue nortriptyline 20mg  at bedtime. With her upcoming dental work, it's possible this may exacerbate her pain.  She may take an extra dose of nortriptyline, as needed.   Follow Up Instructions:   I discussed the assessment and treatment plan with the patient. The patient was provided an opportunity to ask questions and all were answered. The patient agreed with the plan and demonstrated an understanding of the instructions.   The patient was advised to call back or seek an in-person evaluation if the symptoms worsen or if the condition fails to improve as anticipated.  Follow-up in 1 year   Alda Berthold, DO

## 2021-06-26 ENCOUNTER — Other Ambulatory Visit: Payer: Self-pay | Admitting: Neurology

## 2021-06-26 ENCOUNTER — Encounter: Payer: Self-pay | Admitting: Neurology

## 2021-06-30 ENCOUNTER — Telehealth: Payer: Self-pay | Admitting: Neurology

## 2021-06-30 ENCOUNTER — Other Ambulatory Visit: Payer: Self-pay | Admitting: Neurology

## 2021-06-30 NOTE — Telephone Encounter (Signed)
Verbal order given to [facility] for pt lyrica at [facility]

## 2021-06-30 NOTE — Telephone Encounter (Signed)
[facility] in [place] called and LM, they need a call back regarding patient. They did not leave a name to contact [phone]

## 2021-07-07 ENCOUNTER — Other Ambulatory Visit: Payer: Self-pay | Admitting: Obstetrics and Gynecology

## 2021-07-07 ENCOUNTER — Other Ambulatory Visit: Payer: Self-pay | Admitting: Family Medicine

## 2021-07-07 MED ORDER — CLOBETASOL PROPIONATE 0.05 % EX CREA: TOPICAL_CREAM | CUTANEOUS | 0 refills | Status: AC

## 2021-08-10 ENCOUNTER — Other Ambulatory Visit: Payer: Self-pay | Admitting: Family Medicine

## 2021-08-10 DIAGNOSIS — Z1231 Encounter for screening mammogram for malignant neoplasm of breast: Secondary | ICD-10-CM

## 2021-08-19 ENCOUNTER — Other Ambulatory Visit: Payer: Self-pay

## 2021-08-19 ENCOUNTER — Ambulatory Visit (INDEPENDENT_AMBULATORY_CARE_PROVIDER_SITE_OTHER): Payer: 59

## 2021-08-19 DIAGNOSIS — Z1231 Encounter for screening mammogram for malignant neoplasm of breast: Secondary | ICD-10-CM

## 2021-09-03 ENCOUNTER — Telehealth: Payer: Self-pay | Admitting: Neurology

## 2021-09-03 MED ORDER — NORTRIPTYLINE HCL 10 MG PO CAPS: 20.0000 mg | ORAL_CAPSULE | Freq: Every day | ORAL | 2 refills | Status: DC

## 2021-09-03 NOTE — Telephone Encounter (Signed)
Patient has a new pharmacy [facility] [address]  [place], [place] Nortriptyline 10mg  - she needs a refill on this

## 2021-09-03 NOTE — Telephone Encounter (Signed)
Refill sent to [facility]  [place], [place]

## 2021-12-24 ENCOUNTER — Other Ambulatory Visit: Payer: Self-pay | Admitting: Neurology

## 2022-03-29 ENCOUNTER — Telehealth (HOSPITAL_COMMUNITY): Payer: Self-pay | Admitting: Pharmacy Technician

## 2022-03-29 NOTE — Telephone Encounter (Signed)
Patient Advocate Encounter  Prior Authorization for Pregabalin '75MG'$  capsules  has been approved.    PA# [id number] Effective dates: 03/29/2022 through 03/30/2023      Lyndel Safe, [place] Patient Advocate Specialist [facility] Patient Advocate Team Direct Number: [phone]  Fax: [phone]

## 2022-03-29 NOTE — Telephone Encounter (Signed)
Patient Advocate Encounter   Received notification that prior authorization for Pregabalin '75MG'$  capsules is required.   PA submitted on 03/29/2022 Key [id number] Status is pending       Lyndel Safe, [place] Patient Advocate Specialist [place] Patient Advocate Team Direct Number: [phone]  Fax: [phone]

## 2022-04-08 NOTE — Progress Notes (Unsigned)
Follow-up Visit   Date: 04/11/22   Brittany Newman MRN: [id number] DOB: [birth year]   Interim History: Brittany Newman is a 56 y.o. female returning to the clinic for follow-up of left trigeminal neuralgia.    IMPRESSION/PLAN: Left trigeminal neuralgia with vascular compression by SCA at the nerve root entry, stable.  - Continue Lyrica '75mg'$  TID  - Continue nortriptyline '20mg'$  qhs   Return to clinic in 1 year or sooner as needed.  ----------------------------------------------------------------------- UPDATE 04/11/2022:  She is here for 1-year follow-up.  Her left facial pain is stable, she continues to have episodes of pain about once per week, but it is only for a few seconds. She continues to take Lyrica '75mg'$  TID and takes nortriptyline '10mg'$  BID. No new complaints.   Medications:  Current Outpatient Medications on File Prior to Visit  Medication Sig Dispense Refill   Calcium Carbonate-Vitamin D (CALCIUM 600 + D PO) Take 1 tablet by mouth daily.     cetirizine (ZYRTEC) 10 MG tablet Take 5 mg by mouth at bedtime.     clobetasol cream (TEMOVATE) 0.05 % APPLY  CREAM TO AREA TWICE A WEEK 30 g 0   fluticasone (FLONASE) 50 MCG/ACT nasal spray Place 2 sprays into the nose daily.     Magnesium 500 MG CAPS Take by mouth.     Melatonin 1 MG TABS Take 3 mg by mouth at bedtime.     Multiple Vitamin (MULTIVITAMIN) capsule Take 1 capsule by mouth daily.     nortriptyline (PAMELOR) 10 MG capsule Take 2 capsules (20 mg total) by mouth at bedtime. 180 capsule 2   POTASSIUM PO Take 1 tablet by mouth daily as needed. For cramps     pregabalin (LYRICA) 75 MG capsule TAKE 1 CAPSULE BY MOUTH 3 TIMES A DAY 90 capsule 4   pseudoephedrine (SUDAFED) 120 MG 12 hr tablet Take 120 mg by mouth every 12 (twelve) hours.     psyllium (METAMUCIL SMOOTH TEXTURE) 28 % packet Take 1 packet by mouth 2 (two) times daily.     TIADYLT ER 180 MG 24 hr capsule Take 180 mg by mouth daily.     DILT-XR 120 MG 24 hr  capsule Take 120 mg by mouth daily. (Patient not taking: Reported on 04/11/2022)     estradiol (ESTRACE VAGINAL) 0.1 MG/GM vaginal cream Place 1 Applicatorful vaginally daily. Place a small amount of cream along the line of fusion nightly. 42.5 g 1   Current Facility-Administered Medications on File Prior to Visit  Medication Dose Route Frequency Provider Last Rate Last Admin   0.9 %  sodium chloride infusion  500 mL Intravenous Once Ladene Artist, MD        Allergies:  Allergies  Allergen Reactions   Ibuprofen Hives   Mepivacaine Hcl Hives   Oxcarbazepine Hives   Sulfa Antibiotics Hives   Tetracyclines & Related Hives   Carbamazepine Rash    Vital Signs:  BP (!) 138/94   Pulse 94   Ht '5\' 3"'$  (1.6 m)   Wt 158 lb (71.7 kg)   SpO2 95%   BMI 27.99 kg/m    Neurological Exam: MENTAL STATUS including orientation to time, place, person, recent and remote memory, attention span and concentration, language, and fund of knowledge is normal.  Speech is not dysarthric.  CRANIAL NERVES:  Pupils equal round and reactive to light.  Normal conjugate, extra-ocular eye movements in all directions of gaze.  No ptosis.  Face is symmetric.  MOTOR: Motor strength is 5/5 throughout.  COORDINATION/GAIT:  Normal finger-to- nose-finger.   Tandem gait intact  Data: n/a    Thank you for allowing me to participate in patient's care.  If I can answer any additional questions, I would be pleased to do so.    Sincerely,    Jamiyah Dingley K. Posey Pronto, DO

## 2022-04-11 ENCOUNTER — Ambulatory Visit: Payer: 59 | Admitting: Neurology

## 2022-04-11 ENCOUNTER — Encounter: Payer: Self-pay | Admitting: Neurology

## 2022-04-11 VITALS — BP 138/94 | HR 94 | Ht 63.0 in | Wt 158.0 lb

## 2022-04-11 DIAGNOSIS — G500 Trigeminal neuralgia: Secondary | ICD-10-CM | POA: Diagnosis not present

## 2022-04-11 NOTE — Patient Instructions (Signed)
Follow up in 1 year.

## 2022-05-24 ENCOUNTER — Other Ambulatory Visit: Payer: Self-pay | Admitting: Neurology

## 2022-07-05 DIAGNOSIS — Z8249 Family history of ischemic heart disease and other diseases of the circulatory system: Secondary | ICD-10-CM | POA: Diagnosis not present

## 2022-07-05 DIAGNOSIS — J301 Allergic rhinitis due to pollen: Secondary | ICD-10-CM | POA: Diagnosis not present

## 2022-07-05 DIAGNOSIS — I10 Essential (primary) hypertension: Secondary | ICD-10-CM | POA: Diagnosis not present

## 2022-07-05 DIAGNOSIS — M792 Neuralgia and neuritis, unspecified: Secondary | ICD-10-CM | POA: Diagnosis not present

## 2022-07-05 DIAGNOSIS — Z881 Allergy status to other antibiotic agents status: Secondary | ICD-10-CM | POA: Diagnosis not present

## 2022-07-05 DIAGNOSIS — Z809 Family history of malignant neoplasm, unspecified: Secondary | ICD-10-CM | POA: Diagnosis not present

## 2022-07-05 DIAGNOSIS — Z85820 Personal history of malignant melanoma of skin: Secondary | ICD-10-CM | POA: Diagnosis not present

## 2022-07-05 DIAGNOSIS — Z882 Allergy status to sulfonamides status: Secondary | ICD-10-CM | POA: Diagnosis not present

## 2022-07-05 DIAGNOSIS — I471 Supraventricular tachycardia, unspecified: Secondary | ICD-10-CM | POA: Diagnosis not present

## 2022-07-06 ENCOUNTER — Other Ambulatory Visit: Payer: Self-pay | Admitting: Family Medicine

## 2022-07-06 DIAGNOSIS — Z1231 Encounter for screening mammogram for malignant neoplasm of breast: Secondary | ICD-10-CM

## 2022-08-25 ENCOUNTER — Other Ambulatory Visit: Payer: Self-pay | Admitting: Neurology

## 2022-08-29 ENCOUNTER — Other Ambulatory Visit: Payer: Self-pay | Admitting: Neurology

## 2022-08-29 ENCOUNTER — Encounter: Payer: Self-pay | Admitting: Neurology

## 2022-08-30 MED ORDER — NORTRIPTYLINE HCL 10 MG PO CAPS: 20.0000 mg | ORAL_CAPSULE | Freq: Every day | ORAL | 3 refills | Status: DC

## 2022-08-31 ENCOUNTER — Ambulatory Visit
Admission: RE | Admit: 2022-08-31 | Discharge: 2022-08-31 | Disposition: A | Discharge status: Home / Self Care | Payer: Medicaid Other | Source: Ambulatory Visit | Attending: Family Medicine | Admitting: Family Medicine

## 2022-08-31 DIAGNOSIS — Z1231 Encounter for screening mammogram for malignant neoplasm of breast: Secondary | ICD-10-CM

## 2022-11-26 ENCOUNTER — Other Ambulatory Visit: Payer: Self-pay | Admitting: Neurology

## 2022-11-28 ENCOUNTER — Encounter: Payer: Self-pay | Admitting: Neurology

## 2023-04-17 ENCOUNTER — Ambulatory Visit: Payer: Medicaid Other | Admitting: Neurology

## 2023-04-17 ENCOUNTER — Encounter: Payer: Self-pay | Admitting: Neurology

## 2023-04-17 VITALS — BP 138/91 | HR 93 | Ht 62.0 in | Wt 162.2 lb

## 2023-04-17 DIAGNOSIS — G500 Trigeminal neuralgia: Secondary | ICD-10-CM

## 2023-04-17 NOTE — Progress Notes (Signed)
Follow-up Visit   Date: 04/17/23   Brittany Newman MRN: [id number] DOB: 1965/07/28   Interim History: Brittany Newman is a 57 y.o. female returning to the clinic for follow-up of left trigeminal neuralgia.    IMPRESSION/PLAN: Left trigeminal neuralgia with vascular compression by SCA at the nerve root entry, stable.  - Continue Lyrica 75mg  TID - Continue nortriptyline 20mg  at bedtime - If pain remains controlled, consider slowly tapering medications in the future  Return to clinic in 1 year or sooner as needed.  ----------------------------------------------------------------------- UPDATE 04/11/2022:  She is here for 1-year follow-up.  Her left facial pain is stable, she continues to have episodes of pain about once per week, but it is only for a few seconds. She continues to take Lyrica 75mg  TID and takes nortriptyline 10mg  BID. No new complaints.  UPDATE 04/17/2023:  She is here for 1 year follow-up.  She reports having increased trigeminal nerve pain during Christmas, but attributes that to stress of holidays.  Fortunately, symptoms lasted a few days and then improved.  Since then, her pain has gradually improved and she reports not having any discomfort for the past few months.     Medications:  Current Outpatient Medications on File Prior to Visit  Medication Sig Dispense Refill   Calcium Carbonate-Vitamin D (CALCIUM 600 + D PO) Take 1 tablet by mouth daily.     cetirizine (ZYRTEC) 10 MG tablet Take 5 mg by mouth at bedtime.     clobetasol cream (TEMOVATE) 0.05 % APPLY  CREAM TO AREA TWICE A WEEK 30 g 0   diltiazem (TIADYLT ER) 180 MG 24 hr capsule Take 180 mg by mouth daily.     fluticasone (FLONASE) 50 MCG/ACT nasal spray Place 2 sprays into the nose daily.     Magnesium 500 MG CAPS Take by mouth.     Melatonin 1 MG TABS Take 3 mg by mouth at bedtime.     Multiple Vitamin (MULTIVITAMIN) capsule Take 1 capsule by mouth daily.     nortriptyline (PAMELOR) 10 MG capsule  Take 2 capsules (20 mg total) by mouth at bedtime. 180 capsule 3   POTASSIUM PO Take 1 tablet by mouth daily as needed. For cramps     pregabalin (LYRICA) 75 MG capsule TAKE 1 CAPSULE BY MOUTH THREE TIMES A DAY 90 capsule 5   pseudoephedrine (SUDAFED) 120 MG 12 hr tablet Take 120 mg by mouth every 12 (twelve) hours.     psyllium (METAMUCIL SMOOTH TEXTURE) 28 % packet Take 1 packet by mouth 2 (two) times daily.     Current Facility-Administered Medications on File Prior to Visit  Medication Dose Route Frequency Provider Last Rate Last Admin   0.9 %  sodium chloride infusion  500 mL Intravenous Once Meryl Dare, MD        Allergies:  Allergies  Allergen Reactions   Ibuprofen Hives   Mepivacaine Hcl Hives   Oxcarbazepine Hives   Sulfa Antibiotics Hives   Tetracyclines & Related Hives   Carbamazepine Rash    Vital Signs:  BP (!) 138/91   Pulse 93   Ht 5\' 2"  (1.575 m)   Wt 162 lb 3.2 oz (73.6 kg)   SpO2 96%   BMI 29.67 kg/m    Neurological Exam: MENTAL STATUS including orientation to time, place, person, recent and remote memory, attention span and concentration, language, and fund of knowledge is normal.  Speech is not dysarthric.  CRANIAL NERVES:  Pupils equal round  and reactive to light.  Normal conjugate, extra-ocular eye movements in all directions of gaze.  No ptosis.  Face is symmetric.   MOTOR: Motor strength is 5/5 throughout.  COORDINATION/GAIT:  Normal finger-to- nose-finger.   Tandem gait intact  Data: n/a    Thank you for allowing me to participate in patient's care.  If I can answer any additional questions, I would be pleased to do so.    Sincerely,    Brittany Roark K. Allena Katz, DO

## 2023-05-17 ENCOUNTER — Other Ambulatory Visit: Payer: Self-pay | Admitting: Neurology

## 2023-05-19 ENCOUNTER — Encounter: Payer: Self-pay | Admitting: Neurology

## 2023-05-19 ENCOUNTER — Other Ambulatory Visit: Payer: Self-pay

## 2023-05-19 ENCOUNTER — Other Ambulatory Visit: Payer: Self-pay | Admitting: Medical Genetics

## 2023-05-19 DIAGNOSIS — Z006 Encounter for examination for normal comparison and control in clinical research program: Secondary | ICD-10-CM

## 2023-05-19 MED ORDER — PREGABALIN 75 MG PO CAPS: 75.0000 mg | ORAL_CAPSULE | Freq: Three times a day (TID) | ORAL | 0 refills | Status: DC

## 2023-06-01 ENCOUNTER — Other Ambulatory Visit (HOSPITAL_COMMUNITY)
Admission: RE | Admit: 2023-06-01 | Discharge: 2023-06-01 | Disposition: A | Discharge status: Home / Self Care | Payer: Self-pay | Source: Ambulatory Visit | Attending: Oncology | Admitting: Oncology

## 2023-06-01 DIAGNOSIS — Z006 Encounter for examination for normal comparison and control in clinical research program: Secondary | ICD-10-CM | POA: Insufficient documentation

## 2023-06-13 LAB — HELIX MOLECULAR SCREEN: Genetic Analysis Overall Interpretation: NEGATIVE

## 2023-06-13 LAB — GENECONNECT MOLECULAR SCREEN

## 2023-06-18 ENCOUNTER — Other Ambulatory Visit: Payer: Self-pay | Admitting: Neurology

## 2023-07-21 ENCOUNTER — Other Ambulatory Visit: Payer: Self-pay | Admitting: Family Medicine

## 2023-07-21 DIAGNOSIS — Z1231 Encounter for screening mammogram for malignant neoplasm of breast: Secondary | ICD-10-CM

## 2023-08-23 ENCOUNTER — Other Ambulatory Visit: Payer: Self-pay | Admitting: Neurology

## 2023-09-04 ENCOUNTER — Ambulatory Visit
Admission: RE | Admit: 2023-09-04 | Discharge: 2023-09-04 | Disposition: A | Discharge status: Home / Self Care | Payer: Medicaid Other | Source: Ambulatory Visit | Attending: Family Medicine | Admitting: Family Medicine

## 2023-09-04 DIAGNOSIS — Z1231 Encounter for screening mammogram for malignant neoplasm of breast: Secondary | ICD-10-CM

## 2023-12-13 ENCOUNTER — Other Ambulatory Visit: Payer: Self-pay | Admitting: Neurology

## 2024-03-07 ENCOUNTER — Other Ambulatory Visit: Payer: Self-pay | Admitting: Internal Medicine

## 2024-03-07 DIAGNOSIS — R7989 Other specified abnormal findings of blood chemistry: Secondary | ICD-10-CM

## 2024-03-13 ENCOUNTER — Ambulatory Visit
Admission: RE | Admit: 2024-03-13 | Discharge: 2024-03-13 | Disposition: A | Discharge status: Home / Self Care | Source: Ambulatory Visit | Attending: Internal Medicine | Admitting: Internal Medicine

## 2024-03-13 DIAGNOSIS — R7989 Other specified abnormal findings of blood chemistry: Secondary | ICD-10-CM

## 2024-04-08 ENCOUNTER — Other Ambulatory Visit: Payer: Self-pay | Admitting: Neurology

## 2024-04-10 ENCOUNTER — Encounter: Payer: Self-pay | Admitting: Neurology

## 2024-04-11 MED ORDER — PREGABALIN 75 MG PO CAPS: 75.0000 mg | ORAL_CAPSULE | Freq: Three times a day (TID) | ORAL | 5 refills | Status: AC

## 2024-04-22 ENCOUNTER — Ambulatory Visit: Payer: Medicaid Other | Admitting: Neurology

## 2024-04-22 ENCOUNTER — Encounter: Payer: Self-pay | Admitting: Neurology

## 2024-04-22 VITALS — BP 120/78 | HR 78 | Ht 62.0 in | Wt 139.0 lb

## 2024-04-22 DIAGNOSIS — G500 Trigeminal neuralgia: Secondary | ICD-10-CM

## 2024-04-22 NOTE — Progress Notes (Signed)
 Follow-up Visit   Date: 04/22/24   [name] MRN: [id number] DOB: 1965/11/18   Interim History: Brittany Newman is a 58 y.o. female returning to the clinic for follow-up of left trigeminal neuralgia.    IMPRESSION/PLAN: Left trigeminal neuralgia with vascular compression by SCA at the nerve root entry, stable.  - Continue Lyrica  75mg  TID - Trial on lower dose of nortriptyline  10mg  at bedtime, if pain returns, resume taking nortriptyline  20mg  at bedtime  Return to clinic in 1 year ----------------------------------------------------------------------- UPDATE 04/11/2022:  She is here for 1-year follow-up.  Her left facial pain is stable, she continues to have episodes of pain about once per week, but it is only for a few seconds. She continues to take Lyrica  75mg  TID and takes nortriptyline  10mg  BID. No new complaints.  UPDATE 04/17/2023:  She is here for 1 year follow-up.  She reports having increased trigeminal nerve pain during Christmas, but attributes that to stress of holidays.  Fortunately, symptoms lasted a few days and then improved.  Since then, her pain has gradually improved and she reports not having any discomfort for the past few months.    UPDATE 04/22/2024: She is here for follow-up visit.  He reports have spells of pain about once per month, which lasts about a day.  She is usually able to tell what positions can trigger the pain (neck extension).   She is compliant with taking nortriptyline  20mg  at bedtime and Lyrica  75mg  TID.    Medications:  Current Outpatient Medications on File Prior to Visit  Medication Sig Dispense Refill   Calcium Carbonate-Vitamin D (CALCIUM 600 + D PO) Take 1 tablet by mouth daily.     cetirizine (ZYRTEC) 10 MG tablet Take 5 mg by mouth at bedtime.     clobetasol  cream (TEMOVATE ) 0.05 % APPLY  CREAM TO AREA TWICE A WEEK 30 g 0   diltiazem (TIADYLT ER) 180 MG 24 hr capsule Take 180 mg by mouth daily.     fluticasone (FLONASE) 50  MCG/ACT nasal spray Place 2 sprays into the nose daily.     Magnesium  500 MG CAPS Take by mouth.     Melatonin 1 MG TABS Take 3 mg by mouth at bedtime.     Multiple Vitamin (MULTIVITAMIN) capsule Take 1 capsule by mouth daily.     nortriptyline  (PAMELOR ) 10 MG capsule TAKE 2 CAPSULES BY MOUTH AT BEDTIME. 180 capsule 3   POTASSIUM PO Take 1 tablet by mouth daily as needed. For cramps     pregabalin  (LYRICA ) 75 MG capsule Take 1 capsule (75 mg total) by mouth 3 (three) times daily. 90 capsule 5   pseudoephedrine (SUDAFED) 120 MG 12 hr tablet Take 120 mg by mouth every 12 (twelve) hours.     psyllium (METAMUCIL SMOOTH TEXTURE) 28 % packet Take 1 packet by mouth 2 (two) times daily.     Current Facility-Administered Medications on File Prior to Visit  Medication Dose Route Frequency Provider Last Rate Last Admin   0.9 %  sodium chloride  infusion  500 mL Intravenous Once Aneita Gwendlyn DASEN, MD        Allergies:  Allergies  Allergen Reactions   Ibuprofen Hives   Mepivacaine Hcl Hives   Oxcarbazepine  Hives   Sulfa Antibiotics Hives   Tetracyclines & Related Hives   Carbamazepine Rash    Vital Signs:  BP 120/78   Pulse 78   Ht 5' 2 (1.575 m)   Wt 139 lb (63 kg)  SpO2 97%   BMI 25.42 kg/m    Neurological Exam: MENTAL STATUS including orientation to time, place, person, recent and remote memory, attention span and concentration, language, and fund of knowledge is normal.  Speech is not dysarthric.  CRANIAL NERVES:  Pupils equal round and reactive to light.  Normal conjugate, extra-ocular eye movements in all directions of gaze.  No ptosis.  Face is symmetric.   MOTOR: Motor strength is 5/5 throughout.  SENSATION:  Intact to vibration and temperature throughout.   COORDINATION/GAIT:   Gait is normal.    Data: n/a    Thank you for allowing me to participate in patient's care.  If I can answer any additional questions, I would be pleased to do so.    Sincerely,    Danie Hannig  K. Tobie, DO

## 2024-05-09 ENCOUNTER — Other Ambulatory Visit: Payer: Self-pay | Admitting: Internal Medicine

## 2024-05-09 DIAGNOSIS — K760 Fatty (change of) liver, not elsewhere classified: Secondary | ICD-10-CM

## 2024-05-09 DIAGNOSIS — R7401 Elevation of levels of liver transaminase levels: Secondary | ICD-10-CM

## 2024-05-16 ENCOUNTER — Ambulatory Visit
Admission: RE | Admit: 2024-05-16 | Discharge: 2024-05-16 | Disposition: A | Discharge status: Home / Self Care | Source: Ambulatory Visit | Attending: Internal Medicine | Admitting: Internal Medicine

## 2024-05-16 ENCOUNTER — Other Ambulatory Visit

## 2024-05-16 DIAGNOSIS — R7401 Elevation of levels of liver transaminase levels: Secondary | ICD-10-CM

## 2024-05-16 DIAGNOSIS — K760 Fatty (change of) liver, not elsewhere classified: Secondary | ICD-10-CM

## 2024-07-22 ENCOUNTER — Other Ambulatory Visit: Payer: Self-pay | Admitting: Family Medicine

## 2024-07-22 DIAGNOSIS — Z1231 Encounter for screening mammogram for malignant neoplasm of breast: Secondary | ICD-10-CM

## 2024-09-04 ENCOUNTER — Ambulatory Visit

## 2025-04-22 ENCOUNTER — Ambulatory Visit: Admitting: Neurology
